# Patient Record
Sex: Male | Born: 1970 | Hispanic: Yes | Marital: Married | Smoking: Former smoker
Health system: Southern US, Community
[De-identification: ages and names within clinical notes are randomized; demographics above are authoritative.]

## PROBLEM LIST (undated history)

## (undated) DIAGNOSIS — E119 Type 2 diabetes mellitus without complications: Secondary | ICD-10-CM

## (undated) DIAGNOSIS — F419 Anxiety disorder, unspecified: Secondary | ICD-10-CM

## (undated) DIAGNOSIS — E7800 Pure hypercholesterolemia, unspecified: Secondary | ICD-10-CM

## (undated) DIAGNOSIS — I10 Essential (primary) hypertension: Secondary | ICD-10-CM

---

## 2014-08-30 HISTORY — PX: CARDIAC CATHETERIZATION: SHX172

## 2016-01-29 ENCOUNTER — Encounter (HOSPITAL_COMMUNITY): Payer: Self-pay | Admitting: Emergency Medicine

## 2016-01-29 ENCOUNTER — Observation Stay (HOSPITAL_COMMUNITY)
Admission: EM | Admit: 2016-01-29 | Discharge: 2016-01-30 | Disposition: A | Discharge status: Home / Self Care | Payer: Self-pay | Attending: Internal Medicine | Admitting: Internal Medicine

## 2016-01-29 DIAGNOSIS — R778 Other specified abnormalities of plasma proteins: Secondary | ICD-10-CM

## 2016-01-29 DIAGNOSIS — I160 Hypertensive urgency: Secondary | ICD-10-CM | POA: Insufficient documentation

## 2016-01-29 DIAGNOSIS — Z6836 Body mass index (BMI) 36.0-36.9, adult: Secondary | ICD-10-CM | POA: Insufficient documentation

## 2016-01-29 DIAGNOSIS — I10 Essential (primary) hypertension: Principal | ICD-10-CM | POA: Insufficient documentation

## 2016-01-29 DIAGNOSIS — E669 Obesity, unspecified: Secondary | ICD-10-CM | POA: Insufficient documentation

## 2016-01-29 DIAGNOSIS — R7989 Other specified abnormal findings of blood chemistry: Secondary | ICD-10-CM

## 2016-01-29 HISTORY — DX: Essential (primary) hypertension: I10

## 2016-01-29 LAB — BASIC METABOLIC PANEL
ANION GAP: 7 (ref 5–15)
BUN: 8 mg/dL (ref 6–20)
CALCIUM: 8.8 mg/dL — AB (ref 8.9–10.3)
CHLORIDE: 105 mmol/L (ref 101–111)
CO2: 27 mmol/L (ref 22–32)
Creatinine, Ser: 0.65 mg/dL (ref 0.61–1.24)
GFR calc non Af Amer: >60 mL/min (ref >60)
Glucose, Bld: 103 mg/dL — ABNORMAL HIGH (ref 65–99)
Potassium: 3.7 mmol/L (ref 3.5–5.1)
SODIUM: 139 mmol/L (ref 135–145)

## 2016-01-29 LAB — URINE MICROSCOPIC-ADD ON

## 2016-01-29 LAB — URINALYSIS, ROUTINE W REFLEX MICROSCOPIC
BILIRUBIN URINE: NEGATIVE
GLUCOSE, UA: NEGATIVE mg/dL
KETONES UR: NEGATIVE mg/dL
Leukocytes, UA: NEGATIVE
Nitrite: NEGATIVE
PH: 7.5 (ref 5.0–8.0)
PROTEIN: NEGATIVE mg/dL
Specific Gravity, Urine: 1.009 (ref 1.005–1.030)

## 2016-01-29 LAB — CBC
HEMATOCRIT: 43.9 % (ref 39.0–52.0)
Hemoglobin: 16 g/dL (ref 13.0–17.0)
MCH: 31.9 pg (ref 26.0–34.0)
MCHC: 36.4 g/dL — AB (ref 30.0–36.0)
MCV: 87.5 fL (ref 78.0–100.0)
PLATELETS: 210 10*3/uL (ref 150–400)
RBC: 5.02 MIL/uL (ref 4.22–5.81)
RDW: 12.4 % (ref 11.5–15.5)
WBC: 9.6 10*3/uL (ref 4.0–10.5)

## 2016-01-29 LAB — TROPONIN I: Troponin I: 0.07 ng/mL — ABNORMAL HIGH (ref <0.031)

## 2016-01-29 LAB — CBG MONITORING, ED: Glucose-Capillary: 93 mg/dL (ref 65–99)

## 2016-01-29 MED ORDER — HYDRALAZINE HCL 20 MG/ML IJ SOLN
10.0000 mg | INTRAMUSCULAR | Status: DC | PRN
  Administered 2016-01-29 – 2016-01-30 (×2): 10 mg via INTRAVENOUS
  Filled 2016-01-29 (×2): qty 1

## 2016-01-29 MED ORDER — AMLODIPINE BESYLATE 5 MG PO TABS
5.0000 mg | ORAL_TABLET | Freq: Every day | ORAL | Status: DC
  Administered 2016-01-30: 5 mg via ORAL
  Filled 2016-01-29: qty 1

## 2016-01-29 MED ORDER — ASPIRIN 81 MG PO CHEW
CHEWABLE_TABLET | ORAL | Status: AC
  Administered 2016-01-29: 324 mg via ORAL
  Filled 2016-01-29: qty 4

## 2016-01-29 MED ORDER — CLONIDINE HCL 0.1 MG PO TABS
0.2000 mg | ORAL_TABLET | Freq: Once | ORAL | Status: AC
  Administered 2016-01-29: 0.2 mg via ORAL
  Filled 2016-01-29: qty 2

## 2016-01-29 MED ORDER — ENOXAPARIN SODIUM 40 MG/0.4ML ~~LOC~~ SOLN
40.0000 mg | SUBCUTANEOUS | Status: DC
  Administered 2016-01-30: 40 mg via SUBCUTANEOUS
  Filled 2016-01-29: qty 0.4

## 2016-01-29 MED ORDER — ASPIRIN 81 MG PO CHEW
324.0000 mg | CHEWABLE_TABLET | Freq: Once | ORAL | Status: AC
  Administered 2016-01-29: 324 mg via ORAL

## 2016-01-29 NOTE — H&P (Signed)
History and Physical    Cameron Fling Huey ZOX:[id number] DOB: Nov 09, 1970 DOA: 01/29/2016   PCP: No primary care provider on file. Chief Complaint:  Chief Complaint  Patient presents with  . Dizziness  . Hypertension    HPI: Cameron Haney is a 45 y.o. male with medical history significant of hypertension, treated for this in the past in [place] but not currently on any treatment for it.  He presents to the ED with c/o dizziness.  Has history of same associated with hypertension in the past.  No chest pain, no SOB, does have bilateral headache, no N/V, no edema.  No treatment PTA  ED Course: Initial BP 192/119, given clonadine in ED and BP came down to 161/116.  Troponin is 0.07, EKG is strongly suggestive of LVH with a secondary repol abnormality.  Review of Systems: As per HPI otherwise 10 point review of systems negative.    Past Medical History  Diagnosis Date  . Hypertension     History reviewed. No pertinent past surgical history.   reports that he has never smoked. He does not have any smokeless tobacco history on file. He reports that he drinks alcohol. His drug history is not on file.  No Known Allergies  History reviewed. No pertinent family history.   Prior to Admission medications   Medication Sig Start Date End Date Taking? Authorizing Provider  alum & mag hydroxide-simeth (MAALOX ADVANCED) 200-200-20 MG/5ML suspension Take 30 mLs by mouth every 6 (six) hours as needed for indigestion or heartburn.   Yes Historical Provider, MD    Physical Exam: Filed Vitals:   01/29/16 2200 01/29/16 2228 01/29/16 2230 01/29/16 2300  BP: 183/103 147/112 159/105 161/116  Pulse: 76 76 75 73  Temp:      TempSrc:      Resp: SpO2: 100% 98% 100% 98%      Constitutional: NAD, calm, comfortable Eyes: PERRL, lids and conjunctivae normal ENMT: Mucous membranes are moist. Posterior pharynx clear of any exudate or lesions.Normal dentition.  Neck: normal,  supple, no masses, no thyromegaly Respiratory: clear to auscultation bilaterally, no wheezing, no crackles. Normal respiratory effort. No accessory muscle use.  Cardiovascular: Regular rate and rhythm, no murmurs / rubs / gallops. No extremity edema. 2+ pedal pulses. No carotid bruits.  Abdomen: no tenderness, no masses palpated. No hepatosplenomegaly. Bowel sounds positive.  Musculoskeletal: no clubbing / cyanosis. No joint deformity upper and lower extremities. Good ROM, no contractures. Normal muscle tone.  Skin: no rashes, lesions, ulcers. No induration Neurologic: CN 2-12 grossly intact. Sensation intact, DTR normal. Strength 5/5 in all 4.  Psychiatric: Normal judgment and insight. Alert and oriented x 3. Normal mood.    Labs on Admission: I have personally reviewed following labs and imaging studies  CBC:  Recent Labs Lab 01/29/16 1940  WBC 9.6  HGB 16.0  HCT 43.9  MCV 87.5  PLT 210   Basic Metabolic Panel:  Recent Labs Lab 01/29/16 1940  NA 139  K 3.7  CL 105  CO2 27  GLUCOSE 103*  BUN 8  CREATININE 0.65  CALCIUM 8.8*   GFR: CrCl cannot be calculated (Unknown ideal weight.). Liver Function Tests: No results for input(s): AST, ALT, ALKPHOS, BILITOT, PROT, ALBUMIN in the last 168 hours. No results for input(s): LIPASE, AMYLASE in the last 168 hours. No results for input(s): AMMONIA in the last 168 hours. Coagulation Profile: No results for input(s): INR, PROTIME in the last 168 hours.  Cardiac Enzymes:  Recent Labs Lab 01/29/16 2120  TROPONINI 0.07*   BNP (last 3 results) No results for input(s): PROBNP in the last 8760 hours. HbA1C: No results for input(s): HGBA1C in the last 72 hours. CBG:  Recent Labs Lab 01/29/16 1955  GLUCAP 93   Lipid Profile: No results for input(s): CHOL, HDL, LDLCALC, TRIG, CHOLHDL, LDLDIRECT in the last 72 hours. Thyroid Function Tests: No results for input(s): TSH, T4TOTAL, FREET4, T3FREE, THYROIDAB in the last 72  hours. Anemia Panel: No results for input(s): VITAMINB12, FOLATE, FERRITIN, TIBC, IRON, RETICCTPCT in the last 72 hours. Urine analysis:    Component Value Date/Time   COLORURINE YELLOW 01/29/2016 2128   APPEARANCEUR CLOUDY* 01/29/2016 2128   LABSPEC 1.009 01/29/2016 2128   PHURINE 7.5 01/29/2016 2128   GLUCOSEU NEGATIVE 01/29/2016 2128   HGBUR TRACE* 01/29/2016 2128   BILIRUBINUR NEGATIVE 01/29/2016 2128   KETONESUR NEGATIVE 01/29/2016 2128   PROTEINUR NEGATIVE 01/29/2016 2128   NITRITE NEGATIVE 01/29/2016 2128   LEUKOCYTESUR NEGATIVE 01/29/2016 2128   Sepsis Labs: @LABRCNTIP (procalcitonin:4,lacticidven:4) )No results found for this or any previous visit (from the past 240 hour(s)).   Radiological Exams on Admission: No results found.  EKG: Independently reviewed.  Assessment/Plan Principal Problem:   Hypertensive urgency Active Problems:   Hypertension    Hypertensive urgency -  PRN hydralazine  Starting norvasc tomorrow AM  Likely will need long term treatment and follow up  2d echo ordered given the EKG findings suggesting LVH and changes worrisome for hypertensive cardiomyopathy  Serial trops ordered  Tele monitor ordered  Not keeping patient NPO at this time as he is having no chest pain and EDP had a hard enough time just convincing him to stay.     DVT prophylaxis: Lovenox Code Status: Full Family Communication: Wife at bedside Consults called: None Admission status: Admit to obs   GARDNER, Heywood IlesJARED M. DO [facility] Pager [phone] from 7PM-7AM  If 7AM-7PM, please contact the day physician for the patient [email or web address] Password [email or web address]  01/29/2016, 11:23 PM

## 2016-01-29 NOTE — ED Notes (Signed)
Pt unable to void at this time. Pt states he urinated before coming to the room and does not have the urge to go at this time.

## 2016-01-29 NOTE — ED Provider Notes (Addendum)
CSN: [id number]     Arrival date & time 01/29/16  1830 History   First MD Initiated Contact with Patient 01/29/16 2054     Chief Complaint  Patient presents with  . Dizziness  . Hypertension     (Consider location/radiation/quality/duration/timing/severity/associated sxs/prior Treatment) HPI Comments: Patient here complaining of several days of dizziness with associated hypertension. Has been treated for hypertension in the past but does not take any medications currently. Denies any anginal type chest pain. Has had a mild headache that is located bitemporally and without associated nausea vomiting. Denies any dyspnea. No lower extremity edema. Symptoms persistent nothing makes them better or worse no treatment use prior to arrival  Patient is a 45 y.o. male presenting with dizziness and hypertension. The history is provided by the patient.  Dizziness Hypertension    Past Medical History  Diagnosis Date  . Hypertension    History reviewed. No pertinent past surgical history. History reviewed. No pertinent family history. Social History  Substance Use Topics  . Smoking status: Never Smoker   . Smokeless tobacco: None  . Alcohol Use: Yes    Review of Systems  Neurological: Positive for dizziness.  All other systems reviewed and are negative.     Allergies  Review of patient's allergies indicates no known allergies.  Home Medications   Prior to Admission medications   Not on File   BP 180/113 mmHg  Pulse 87  Temp(Src) 98.1 F (36.7 C) (Oral)  Resp 20  SpO2 100% Physical Exam  Constitutional: He is oriented to person, place, and time. He appears well-developed and well-nourished.  Non-toxic appearance. No distress.  HENT:  Head: Normocephalic and atraumatic.  Eyes: Conjunctivae, EOM and lids are normal. Pupils are equal, round, and reactive to light.  Neck: Normal range of motion. Neck supple. No tracheal deviation present. No thyroid mass present.   Cardiovascular: Normal rate, regular rhythm and normal heart sounds.  Exam reveals no gallop.   No murmur heard. Pulmonary/Chest: Effort normal and breath sounds normal. No stridor. No respiratory distress. He has no decreased breath sounds. He has no wheezes. He has no rhonchi. He has no rales.  Abdominal: Soft. Normal appearance and bowel sounds are normal. He exhibits no distension. There is no tenderness. There is no rebound and no CVA tenderness.  Musculoskeletal: Normal range of motion. He exhibits no edema or tenderness.  Neurological: He is alert and oriented to person, place, and time. He has normal strength. No cranial nerve deficit or sensory deficit. GCS eye subscore is 4. GCS verbal subscore is 5. GCS motor subscore is 6.  Skin: Skin is warm and dry. No abrasion and no rash noted.  Psychiatric: He has a normal mood and affect. His speech is normal and behavior is normal.  Nursing note and vitals reviewed.   ED Course  Procedures (including critical care time) Labs Review Labs Reviewed  BASIC METABOLIC PANEL - Abnormal; Notable for the following:    Glucose, Bld 103 (*)    Calcium 8.8 (*)    All other components within normal limits  CBC - Abnormal; Notable for the following:    MCHC 36.4 (*)    All other components within normal limits  URINALYSIS, ROUTINE W REFLEX MICROSCOPIC (NOT AT [facility])  CBG MONITORING, ED    Imaging Review No results found. I have personally reviewed and evaluated these images and lab results as part of my medical decision-making.   EKG Interpretation   Date/Time:  Thursday January 29 2016 19:45:22 EDT Ventricular Rate:  84 PR Interval:  154 QRS Duration: 81 QT Interval:  387 QTC Calculation: 457 R Axis:   26 Text Interpretation:  Sinus rhythm LVH with secondary repolarization  abnormality Anterior ST elevation, probably due to LVH Confirmed by Shiree Altemus   MD, Ryan Palermo ([id number]) on 01/29/2016 9:08:52 PM      MDM   Final diagnoses:  None    Patient given aspirin here as well as clonidine 0.2 mg for his hypertension. He is chest pain-free here. Blood pressure has improved. Troponin elevation noted. Will be admitted to the hospital for observation     Lorre Nick, MD 01/29/16 2315  Lorre Nick, MD 01/29/16 2315

## 2016-01-29 NOTE — Progress Notes (Signed)
EDCM spoke to patient at bedside. Patient confirms he [place] have a pcp or [place].  [facility] informed patient of services there.  EDCM also provided patient with list of pcps who accept self pay patients, list of discount pharmacies and websites needymeds.org and GoodRX.com for medication assistance, phone number to inquire about the orange card, phone number to inquire about Medicaid, phone number to inquire about the Affordable Care Act, financial resources in the community such as local churches, salvation [employer], urban ministries, and dental assistance for uninsured patients.  Patient thankful for resources.  No further EDCM needs at this time.  The above information was provided to the patient in Spanish.  Patient able to read information and thankful for services.

## 2016-01-29 NOTE — ED Notes (Signed)
RN will draw blood work from [employer]

## 2016-01-29 NOTE — ED Notes (Signed)
Patient here with complaints of dizziness associated with hypertension. Reports being treated for hypertension in [place]. Last alcohol drink 10 days ago.

## 2016-01-30 ENCOUNTER — Encounter (HOSPITAL_COMMUNITY): Payer: Self-pay

## 2016-01-30 ENCOUNTER — Observation Stay (HOSPITAL_BASED_OUTPATIENT_CLINIC_OR_DEPARTMENT_OTHER): Payer: MEDICAID

## 2016-01-30 DIAGNOSIS — R9431 Abnormal electrocardiogram [ECG] [EKG]: Secondary | ICD-10-CM

## 2016-01-30 DIAGNOSIS — I160 Hypertensive urgency: Secondary | ICD-10-CM

## 2016-01-30 LAB — TROPONIN I
TROPONIN I: 0.36 ng/mL — AB (ref <0.031)
Troponin I: 0.06 ng/mL — ABNORMAL HIGH (ref <0.031)
Troponin I: 0.07 ng/mL — ABNORMAL HIGH (ref <0.031)

## 2016-01-30 LAB — ECHOCARDIOGRAM COMPLETE
HEIGHTINCHES: 63 in
WEIGHTICAEL: 3280 [oz_av]

## 2016-01-30 MED ORDER — LISINOPRIL 5 MG PO TABS: 5.0000 mg | ORAL_TABLET | Freq: Every day | ORAL | Status: DC

## 2016-01-30 MED ORDER — CARVEDILOL 6.25 MG PO TABS
6.2500 mg | ORAL_TABLET | Freq: Two times a day (BID) | ORAL | Status: DC
  Administered 2016-01-30: 6.25 mg via ORAL
  Filled 2016-01-30: qty 1

## 2016-01-30 MED ORDER — ASPIRIN EC 81 MG PO TBEC: 81.0000 mg | DELAYED_RELEASE_TABLET | Freq: Every day | ORAL | Status: DC

## 2016-01-30 MED ORDER — LISINOPRIL 10 MG PO TABS
5.0000 mg | ORAL_TABLET | Freq: Every day | ORAL | Status: DC
  Administered 2016-01-30: 5 mg via ORAL
  Filled 2016-01-30: qty 1

## 2016-01-30 MED ORDER — CARVEDILOL 6.25 MG PO TABS: 6.2500 mg | ORAL_TABLET | Freq: Two times a day (BID) | ORAL | Status: DC

## 2016-01-30 NOTE — Progress Notes (Signed)
Patient discharged home, discharge instructions given and explained to patient/wife and they verbalized understanding, denies any pain/distrss, Skin intact, no wound noted accompanied home by friend.

## 2016-01-30 NOTE — Care Management Note (Signed)
Case Management Note  Patient Details  Name: Era SkeenCruz De Jesus Burt MRN: [id number] Date of Birth: 10/29/1970  Subjective/Objective: Patient has pcp appt @ [facility]-patient voiced understanding.                 Action/Plan:d/c home.   Expected Discharge Date:                  Expected Discharge Plan:  Home/Self Care  In-House Referral:     Discharge planning Services  CM Consult  Post Acute Care Choice:    Choice offered to:     DME Arranged:    DME Agency:     HH Arranged:    HH Agency:     Status of Service:  Completed, signed off  Medicare Important Message Given:    Date Medicare IM Given:    Medicare IM give by:    Date Additional Medicare IM Given:    Additional Medicare Important Message give by:     If discussed at Long Length of Stay Meetings, dates discussed:    Additional Comments:  Lanier ClamMahabir, Colleena Kurtenbach, RN 01/30/2016, 11:19 AM

## 2016-01-30 NOTE — Care Management Note (Signed)
Case Management Note  Patient Details  Name: Cameron Haney MRN: [id number] Date of Birth: 04/20/1971  Subjective/Objective:  45 y/o m admitted w/htn urgency. From home. No insurance-[facility] for [facility], & meds-patient informed. Patient works-[facility]'t [facility]. [facility] liason-will attempt to set up for pcp appt,but patient aware to call on own to schedule appt.$[facility] med list given, Community resources given, & health insurance ino given.                 Action/Plan:d/c plan home.   Expected Discharge Date:                  Expected Discharge Plan:  Home/Self Care  In-House Referral:     Discharge planning Services  CM Consult  Post Acute Care Choice:    Choice offered to:     DME Arranged:    DME Agency:     HH Arranged:    HH Agency:     Status of Service:  Completed, signed off  Medicare Important Message Given:    Date Medicare IM Given:    Medicare IM give by:    Date Additional Medicare IM Given:    Additional Medicare Important Message give by:     If discussed at Long Length of Stay Meetings, dates discussed:    Additional Comments:  Lanier ClamMahabir, Donyale Falcon, RN 01/30/2016, 11:02 AM

## 2016-01-30 NOTE — Progress Notes (Signed)
Echocardiogram 2D Echocardiogram has been performed.  Dorothey BasemanReel, Tris Howell M 01/30/2016, 9:18 AM

## 2016-01-30 NOTE — Discharge Summary (Signed)
Cameron Haney, is a 45 y.o. male  DOB 05/09/1971  MRN [id number].  Admission date:  01/29/2016  Admitting Physician  Hillary BowJared M Gardner, DO  Discharge Date:  01/30/2016   Primary MD  No primary care provider on file.  Recommendations for primary care physician for things to follow:   Monitor blood pressure, routine health maintenance.   Admission Diagnosis  Elevated troponin [R79.89] Essential hypertension [I10]   Discharge Diagnosis  Elevated troponin [R79.89] Essential hypertension [I10]    Principal Problem:   Hypertensive urgency Active Problems:   Hypertension      Past Medical History  Diagnosis Date  . Hypertension     History reviewed. No pertinent past surgical history.     HPI  from the history and physical done on the day of admission:     Cameron KnudsenCruz De Jesus Ike Haney is a 45 y.o. male with medical history significant of hypertension, treated for this in the past in [place] but not currently on any treatment for it. He presents to the ED with c/o dizziness. Has history of same associated with hypertension in the past. No chest pain, no SOB, does have bilateral headache, no N/V, no edema. No treatment PTA  ED Course: Initial BP 192/119, given clonadine in ED and BP came down to 161/116. Troponin is 0.07, EKG is strongly suggestive of LVH with a secondary repol abnormality.    Hospital Course:     1. Hypertensive crisis. Much improved, symptom-free, placed on Coreg and lisinopril, case parchment help to assist with medications and PCP follow-up. Troponin mildly elevated in non-ACS pattern and a flat trajectory secondary to demand ischemia and LVH phenomenon due to uncontrolled blood pressure. No chest pain. Discussed with cardiologist Dr. Jens Somrenshaw. Echogram reviewed which was unremarkable  with preserved EF and no wall motion of normality. Will be discharged home on aspirin, Coreg and lisinopril for blood pressure control. Request PCP to monitor blood pressure closely.  2. Obesity. Follow with PCP for diet control and weight loss..   Follow UP  Follow-up Information    Follow up with [facility]. Schedule an appointment as soon as possible for a visit in 1 week.   Contact information:   [address] [place] [phone]       Consults obtained - Cardiology Dr. Jens Somrenshaw over the phone  Discharge Condition: Stable  Diet and Activity recommendation: See Discharge Instructions below  Discharge Instructions       Discharge Instructions    Diet - low sodium heart healthy    Complete by:  As directed      Discharge instructions    Complete by:  As directed   Follow with Primary MD  in 7 days   Get CBC, CMP, 2 view Chest X ray checked  by Primary MD next visit.    Activity: As tolerated with Full fall precautions use walker/cane & assistance as needed   Disposition Home     Diet:  Heart Healthy    For Heart failure patients - Check your Weight same time everyday, if you gain over 2 pounds, or you develop in leg swelling, experience more shortness of breath or chest pain, call your Primary MD immediately. Follow Cardiac Low Salt Diet and 1.5 lit/day fluid restriction.   On your next visit with your primary care physician please Get Medicines reviewed and adjusted.   Please request your Prim.MD to go over all Hospital Tests and Procedure/Radiological results at the follow up, please get all Hospital records sent to your Prim MD by signing hospital release before you go home.   If you experience worsening of your admission symptoms, develop shortness of breath, life threatening emergency, suicidal or homicidal thoughts you must seek medical attention immediately by calling 911 or calling your MD  immediately  if symptoms less severe.  You Must read complete instructions/literature along with all the possible adverse reactions/side effects for all the Medicines you take and that have been prescribed to you. Take any new Medicines after you have completely understood and accpet all the possible adverse reactions/side effects.   Do not drive, operate heavy machinery, perform activities at heights, swimming or participation in water activities or provide baby sitting services if your were admitted for syncope or siezures until you have seen by Primary MD or a Neurologist and advised to do so again.  Do not drive when taking Pain medications.    Do not take more than prescribed Pain, Sleep and Anxiety Medications  Special Instructions: If you have smoked or chewed Tobacco  in the last 2 yrs please stop smoking, stop any regular Alcohol  and or any Recreational drug use.  Wear Seat belts while driving.   Please note  You were cared for by a hospitalist during your hospital stay. If you have any questions about your discharge medications or the care you received while you were in the hospital after you are discharged, you can call the unit and asked to speak with the hospitalist on call if the hospitalist that took care of you is not available. Once you are discharged, your primary care physician will handle any further medical issues. Please note that NO REFILLS for any discharge medications will be authorized once you are discharged, as it is imperative that you return to your primary care physician (or establish a relationship with a primary care physician if you do not have one) for your aftercare needs so that they can reassess your need for medications and monitor your lab values.     Increase activity slowly    Complete by:  As directed              Discharge Medications       Medication List    TAKE these medications        aspirin EC 81 MG tablet  Take 1 tablet (81 mg  total) by mouth daily.     carvedilol 6.25 MG tablet  Commonly known as:  COREG  Take 1 tablet (6.25 mg total) by mouth 2 (two) times daily with a meal.     lisinopril 5 MG tablet  Commonly known as:  PRINIVIL,ZESTRIL  Take 1 tablet (5 mg total) by mouth daily.     MAALOX ADVANCED 200-200-20 MG/5ML suspension  Generic drug:  alum & mag hydroxide-simeth  Take 30 mLs by mouth every 6 (six) hours as needed for indigestion or heartburn.        Major procedures and Radiology  Reports - PLEASE review detailed and final reports for all details, in brief -   TTE  - Left ventricle: The cavity size was normal. There was moderate concentric hypertrophy. Systolic function was normal. The estimated ejection fraction was in the range of 55% to 60%. Wall motion was normal; there were no regional wall motion abnormalities. Features are consistent with a pseudonormal left ventricular filling pattern, with concomitant abnormal relaxation and increased filling pressure (grade 2 diastolic dysfunction). Doppler parameters are consistent with high ventricular filling pressure. - Aortic valve: Trileaflet; normal thickness, mildly calcified leaflets. There was mild regurgitation. - Mitral valve: There was trivial regurgitation. - Left atrium: The atrium was mildly dilated. - Right atrium: The atrium was mildly dilated. - Pulmonary arteries: PA peak pressure: 32 mm Hg (S).   No results found.  Micro Results      No results found for this or any previous visit (from the past 240 hour(s)).     Today   Subjective    Bao Bazen today has no headache,no chest abdominal pain,no new weakness tingling or numbness, feels much better wants to go home today.     Objective   Blood pressure 138/89, pulse 72, temperature 98.2 F (36.8 C), temperature source Oral, resp. rate 18, height  (1.6 m), weight 92.987 kg (205 lb), SpO2 100 %.   Intake/Output Summary (Last 24 hours) at 01/30/16 1055 Last  data filed at 01/30/16 0917  Gross per 24 hour  Intake    240 ml  Output      0 ml  Net    240 ml    Exam Awake Alert, Oriented x 3, No new F.N deficits, Normal affect [place].AT,PERRAL Supple Neck,No JVD, No cervical lymphadenopathy appriciated.  Symmetrical Chest wall movement, Good air movement bilaterally, CTAB RRR,No Gallops,Rubs or new Murmurs, No Parasternal Heave +ve B.Sounds, Abd Soft, Non tender, No organomegaly appriciated, No rebound -guarding or rigidity. No Cyanosis, Clubbing or edema, No new Rash or bruise   Data Review   CBC w Diff: Lab Results  Component Value Date   WBC 9.6 01/29/2016   HGB 16.0 01/29/2016   HCT 43.9 01/29/2016   PLT 210 01/29/2016    CMP: Lab Results  Component Value Date   NA 139 01/29/2016   K 3.7 01/29/2016   CL 105 01/29/2016   CO2 27 01/29/2016   BUN 8 01/29/2016   CREATININE 0.65 01/29/2016  .   Total Time in preparing paper work, data evaluation and todays exam - 35 minutes  Leroy Sea M.D on 01/30/2016 at 10:55 AM  [facility]   Office  [phone]

## 2016-01-30 NOTE — Discharge Instructions (Signed)
Follow with Primary MD  in 7 days  ° °Get CBC, CMP, 2 view Chest X ray checked  by Primary MD next visit.  ° ° °Activity: As tolerated with Full fall precautions use walker/cane & assistance as needed ° ° °Disposition Home  ° ° °Diet:   Heart Healthy  . ° °For Heart failure patients - Check your Weight same time everyday, if you gain over 2 pounds, or you develop in leg swelling, experience more shortness of breath or chest pain, call your Primary MD immediately. Follow Cardiac Low Salt Diet and 1.5 lit/day fluid restriction. ° ° °On your next visit with your primary care physician please Get Medicines reviewed and adjusted. ° ° °Please request your Prim.MD to go over all Hospital Tests and Procedure/Radiological results at the follow up, please get all Hospital records sent to your Prim MD by signing hospital release before you go home. ° ° °If you experience worsening of your admission symptoms, develop shortness of breath, life threatening emergency, suicidal or homicidal thoughts you must seek medical attention immediately by calling 911 or calling your MD immediately  if symptoms less severe. ° °You Must read complete instructions/literature along with all the possible adverse reactions/side effects for all the Medicines you take and that have been prescribed to you. Take any new Medicines after you have completely understood and accpet all the possible adverse reactions/side effects.  ° °Do not drive, operate heavy machinery, perform activities at heights, swimming or participation in water activities or provide baby sitting services if your were admitted for syncope or siezures until you have seen by Primary MD or a Neurologist and advised to do so again. ° °Do not drive when taking Pain medications.  ° ° °Do not take more than prescribed Pain, Sleep and Anxiety Medications ° °Special Instructions: If you have smoked or chewed Tobacco  in the last 2 yrs please stop smoking, stop any regular Alcohol  and or  any Recreational drug use. ° °Wear Seat belts while driving. ° ° °Please note ° °You were cared for by a hospitalist during your hospital stay. If you have any questions about your discharge medications or the care you received while you were in the hospital after you are discharged, you can call the unit and asked to speak with the hospitalist on call if the hospitalist that took care of you is not available. Once you are discharged, your primary care physician will handle any further medical issues. Please note that NO REFILLS for any discharge medications will be authorized once you are discharged, as it is imperative that you return to your primary care physician (or establish a relationship with a primary care physician if you do not have one) for your aftercare needs so that they can reassess your need for medications and monitor your lab values. ° ° °

## 2016-02-27 ENCOUNTER — Ambulatory Visit: Payer: Self-pay | Admitting: Family Medicine

## 2016-03-15 ENCOUNTER — Ambulatory Visit: Payer: Self-pay | Admitting: Family Medicine

## 2016-06-30 ENCOUNTER — Emergency Department (HOSPITAL_COMMUNITY): Payer: Self-pay

## 2016-06-30 ENCOUNTER — Encounter (HOSPITAL_COMMUNITY): Payer: Self-pay | Admitting: Emergency Medicine

## 2016-06-30 ENCOUNTER — Emergency Department (HOSPITAL_COMMUNITY)
Admission: EM | Admit: 2016-06-30 | Discharge: 2016-06-30 | Disposition: A | Discharge status: Home / Self Care | Payer: Self-pay | Attending: Emergency Medicine | Admitting: Emergency Medicine

## 2016-06-30 DIAGNOSIS — R42 Dizziness and giddiness: Secondary | ICD-10-CM

## 2016-06-30 DIAGNOSIS — Z7982 Long term (current) use of aspirin: Secondary | ICD-10-CM | POA: Insufficient documentation

## 2016-06-30 DIAGNOSIS — I10 Essential (primary) hypertension: Secondary | ICD-10-CM | POA: Insufficient documentation

## 2016-06-30 DIAGNOSIS — Z791 Long term (current) use of non-steroidal anti-inflammatories (NSAID): Secondary | ICD-10-CM | POA: Insufficient documentation

## 2016-06-30 DIAGNOSIS — Z79899 Other long term (current) drug therapy: Secondary | ICD-10-CM | POA: Insufficient documentation

## 2016-06-30 LAB — CBC WITH DIFFERENTIAL/PLATELET
BASOS PCT: 0 %
Basophils Absolute: 0 10*3/uL (ref 0.0–0.1)
EOS ABS: 0.5 10*3/uL (ref 0.0–0.7)
Eosinophils Relative: 6 %
HCT: 43.2 % (ref 39.0–52.0)
HEMOGLOBIN: 15.6 g/dL (ref 13.0–17.0)
LYMPHS ABS: 2.6 10*3/uL (ref 0.7–4.0)
Lymphocytes Relative: 27 %
MCH: 31.3 pg (ref 26.0–34.0)
MCHC: 36.1 g/dL — AB (ref 30.0–36.0)
MCV: 86.7 fL (ref 78.0–100.0)
MONO ABS: 0.7 10*3/uL (ref 0.1–1.0)
MONOS PCT: 7 %
NEUTROS PCT: 60 %
Neutro Abs: 5.6 10*3/uL (ref 1.7–7.7)
Platelets: 211 10*3/uL (ref 150–400)
RBC: 4.98 MIL/uL (ref 4.22–5.81)
RDW: 12.7 % (ref 11.5–15.5)
WBC: 9.4 10*3/uL (ref 4.0–10.5)

## 2016-06-30 LAB — BASIC METABOLIC PANEL
Anion gap: 8 (ref 5–15)
BUN: 14 mg/dL (ref 6–20)
CALCIUM: 9.1 mg/dL (ref 8.9–10.3)
CHLORIDE: 106 mmol/L (ref 101–111)
CO2: 25 mmol/L (ref 22–32)
CREATININE: 0.74 mg/dL (ref 0.61–1.24)
GFR calc Af Amer: >60 mL/min (ref >60)
GFR calc non Af Amer: >60 mL/min (ref >60)
GLUCOSE: 129 mg/dL — AB (ref 65–99)
Potassium: 3.7 mmol/L (ref 3.5–5.1)
Sodium: 139 mmol/L (ref 135–145)

## 2016-06-30 MED ORDER — MECLIZINE HCL 25 MG PO TABS
25.0000 mg | ORAL_TABLET | Freq: Once | ORAL | Status: DC
Start: 2016-06-30 — End: 2016-06-30

## 2016-06-30 MED ORDER — MECLIZINE HCL 25 MG PO TABS: 25.0000 mg | ORAL_TABLET | Freq: Three times a day (TID) | ORAL | 0 refills | Status: DC | PRN

## 2016-06-30 NOTE — ED Provider Notes (Signed)
[facility]EMERGENCY DEPT Provider Note   CSN: [id number] Arrival date & time: 06/30/16  [id number]     History   Chief Complaint Chief Complaint  Patient presents with  . Headache   History of present illness obtained via Spanish interpreter. HPI Cameron Haney is a 45 y.o. male.  HPIwith history of hypertension here for evaluation of headache, dizziness, anxiety, right arm pain. Patient reports over the past 18 days he has had intermittent headache with associated dizziness but he cannot clarify. No dizziness now. He reports anxiety that sometimes causes his blood pressure to rise which results in his headache. He reports right-sided, sharp arm pain that radiates into his fingers. This started last week has not relieved by ibuprofen. He denies fevers, chills, shortness of breath, chest pain, nausea or vomiting, abdominal pain, diarrhea or constipation.reports he had [place] doctor in [place], but came here 4 months ago and has not secured a primary care provider here.  Past Medical History:  Diagnosis Date  . Hypertension     Patient Active Problem List   Diagnosis Date Noted  . Hypertension 01/29/2016  . Hypertensive urgency 01/29/2016    History reviewed. No pertinent surgical history.     Home Medications    Prior to Admission medications   Medication Sig Start Date End Date Taking? Authorizing Provider  alum & mag hydroxide-simeth (MAALOX ADVANCED) 200-200-20 MG/5ML suspension Take 30 mLs by mouth every 6 (six) hours as needed for indigestion or heartburn.   Yes Historical Provider, MD  carvedilol (COREG) 6.25 MG tablet Take 1 tablet (6.25 mg total) by mouth 2 (two) times daily with a meal. Patient taking differently: Take 6.25 mg by mouth 2 (two) times daily as needed (high blood pressure).  01/30/16  Yes Leroy SeaPrashant K Singh, MD  ibuprofen (ADVIL,MOTRIN) 200 MG tablet Take 200-800 mg by mouth every 6 (six) hours as needed for moderate pain.   Yes Historical Provider,  MD  lisinopril (PRINIVIL,ZESTRIL) 5 MG tablet Take 1 tablet (5 mg total) by mouth daily. Patient taking differently: Take 5 mg by mouth daily as needed (high blood pressure).  01/30/16  Yes Leroy SeaPrashant K Singh, MD  aspirin EC 81 MG tablet Take 1 tablet (81 mg total) by mouth daily. Patient not taking: Reported on 06/30/2016 01/30/16   Leroy SeaPrashant K Singh, MD  meclizine (ANTIVERT) 25 MG tablet Take 1 tablet (25 mg total) by mouth 3 (three) times daily as needed for dizziness. 06/30/16   Joycie PeekBenjamin Daniil Labarge, PA-C    Family History No family history on file.  Social History Social History  Substance Use Topics  . Smoking status: Never Smoker  . Smokeless tobacco: Never Used  . Alcohol use Yes     Allergies   Aspirin   Review of Systems Review of Systems A 10 point review of systems was completed and was negative except for pertinent positives and negatives as mentioned in the history of present illness    Physical Exam Updated Vital Signs BP (!) 170/106 (BP Location: Left Arm)   Pulse 77   Temp 98.9 F (37.2 C) (Oral)   Resp 17   Ht 5\' 6"  (1.676 m)   Wt 93 kg   SpO2 98%   BMI 33.09 kg/m   Physical Exam  Constitutional: He is oriented to person, place, and time. He appears well-developed and well-nourished. No distress.  HENT:  Head: Normocephalic and atraumatic.  Right Ear: External ear normal.  Left Ear: External ear normal.  Mouth/Throat: Oropharynx  is clear and moist. No oropharyngeal exudate.  Eyes: Conjunctivae and EOM are normal. Pupils are equal, round, and reactive to light. Right eye exhibits no discharge. Left eye exhibits no discharge.  Neck: Normal range of motion.  No meningismus or nuchal rigidity  Cardiovascular: Normal rate, regular rhythm and normal heart sounds.   Pulmonary/Chest: Effort normal and breath sounds normal.  Abdominal: Soft. There is no tenderness.  Musculoskeletal: Normal range of motion. He exhibits no edema or tenderness.  Neurological: He is  alert and oriented to person, place, and time.  Cranial nerves III through XII grossly intact. Motor strength is 5/5 in all 4 extremities and sensation is intact to light touch. Completes finger to nose coordination movements without difficulty. No nystagmus. Gait is baseline without ataxia.  Skin: No rash noted. He is not diaphoretic.  Psychiatric: He has a normal mood and affect. His behavior is normal. Judgment and thought content normal.  Nursing note and vitals reviewed.    ED Treatments / Results  Labs (all labs ordered are listed, but only abnormal results are displayed) Labs Reviewed  BASIC METABOLIC PANEL - Abnormal; Notable for the following:       Result Value   Glucose, Bld 129 (*)    All other components within normal limits  CBC WITH DIFFERENTIAL/PLATELET - Abnormal; Notable for the following:    MCHC 36.1 (*)    All other components within normal limits    EKG  EKG Interpretation None       Radiology Ct Head Wo Contrast  Result Date: 06/30/2016 CLINICAL DATA:  Headache for 4 days.  Dizziness. EXAM: CT HEAD WITHOUT CONTRAST TECHNIQUE: Contiguous axial images were obtained from the base of the skull through the vertex without intravenous contrast. COMPARISON:  None. FINDINGS: Brain: No acute intracranial abnormality. Specifically, no hemorrhage, hydrocephalus, mass lesion, acute infarction, or significant intracranial injury. Vascular: No hyperdense vessel or unexpected calcification. Skull: No acute calvarial abnormality Sinuses/Orbits: Visualized paranasal sinuses and mastoids clear. Orbital soft tissues unremarkable. Other: None IMPRESSION: No intracranial abnormality. Electronically Signed   By: Charlett NoseKevin  Dover M.D.   On: 06/30/2016 12:45    Procedures Procedures (including critical care time)  Medications Ordered in ED Medications  meclizine (ANTIVERT) tablet 25 mg (not administered)     Initial Impression / Assessment and Plan / ED Course  I have reviewed  the triage vital signs and the nursing notes.  Pertinent labs & imaging results that were available during my care of the patient were reviewed by me and considered in my medical decision making (see chart for details).  Clinical Course    Patient with multiple complaints ongoing over the past 2-1/2 weeks. He is hemodynamically stable here, has a reassuring physical exam. Due to persistent headache and dizziness, we will obtain head CT. Pending screening labs. Anticipate discharge with referral to primary care and Prescription for meclizine. Return precautions discussed. He verbalizes understanding, agrees with plan and subsequent discharge.  Final Clinical Impressions(s) / ED Diagnoses   Final diagnoses:  Hypertension, unspecified type  Dizzy spells    New Prescriptions Discharge Medication List as of 06/30/2016  1:13 PM    START taking these medications   Details  meclizine (ANTIVERT) 25 MG tablet Take 1 tablet (25 mg total) by mouth 3 (three) times daily as needed for dizziness., Starting Wed 06/30/2016, Print         Joycie PeekBenjamin Azavier Creson, PA-C 06/30/16 1550    Laurence Spatesachel Morgan Little, MD 06/30/16 [phone]

## 2016-06-30 NOTE — ED Triage Notes (Signed)
Patient complaining of headache x4 days. Hx of HTN and has been taking his medications as prescribed. Patient alert and speaking in full sentences. Oriented x4. Patient ambulatory.

## 2016-06-30 NOTE — ED Notes (Signed)
Patient unable to sign due to signature pad not working at this time.   Patient given detailed highlighted instructions via translator about follow up care for his arm pain, HTN and medications [facility]

## 2016-06-30 NOTE — Discharge Instructions (Signed)
There does not appear to be an emergent cause for your symptoms at this time. Your labs and CT scan of your head were reassuring.Please follow-up with your doctor this week for reevaluation. Continue taking your blood pressure medications. He may use your meclizine as prescribed for dizziness. Return to ED for any new or worsening symptoms as we discussed.

## 2016-09-12 ENCOUNTER — Emergency Department (HOSPITAL_COMMUNITY)
Admission: EM | Admit: 2016-09-12 | Discharge: 2016-09-12 | Disposition: A | Discharge status: Home / Self Care | Payer: Self-pay | Attending: Emergency Medicine | Admitting: Emergency Medicine

## 2016-09-12 ENCOUNTER — Emergency Department (HOSPITAL_COMMUNITY): Payer: Self-pay

## 2016-09-12 ENCOUNTER — Encounter (HOSPITAL_COMMUNITY): Payer: Self-pay | Admitting: Emergency Medicine

## 2016-09-12 DIAGNOSIS — G510 Bell's palsy: Secondary | ICD-10-CM | POA: Insufficient documentation

## 2016-09-12 DIAGNOSIS — Z7982 Long term (current) use of aspirin: Secondary | ICD-10-CM | POA: Insufficient documentation

## 2016-09-12 DIAGNOSIS — Z79899 Other long term (current) drug therapy: Secondary | ICD-10-CM | POA: Insufficient documentation

## 2016-09-12 DIAGNOSIS — I10 Essential (primary) hypertension: Secondary | ICD-10-CM | POA: Insufficient documentation

## 2016-09-12 LAB — I-STAT CHEM 8, ED
BUN: 9 mg/dL (ref 6–20)
CALCIUM ION: 0.89 mmol/L — AB (ref 1.15–1.40)
CREATININE: 0.6 mg/dL — AB (ref 0.61–1.24)
Chloride: 105 mmol/L (ref 101–111)
Glucose, Bld: 106 mg/dL — ABNORMAL HIGH (ref 65–99)
HEMATOCRIT: 46 % (ref 39.0–52.0)
Hemoglobin: 15.6 g/dL (ref 13.0–17.0)
Potassium: 5.2 mmol/L — ABNORMAL HIGH (ref 3.5–5.1)
SODIUM: 136 mmol/L (ref 135–145)
TCO2: 23 mmol/L (ref 0–100)

## 2016-09-12 MED ORDER — LISINOPRIL 20 MG PO TABS
20.0000 mg | ORAL_TABLET | Freq: Once | ORAL | Status: AC
  Administered 2016-09-12: 20 mg via ORAL
  Filled 2016-09-12: qty 1

## 2016-09-12 MED ORDER — PREDNISONE 20 MG PO TABS
60.0000 mg | ORAL_TABLET | Freq: Once | ORAL | Status: AC
  Administered 2016-09-12: 60 mg via ORAL
  Filled 2016-09-12: qty 3

## 2016-09-12 MED ORDER — LISINOPRIL 20 MG PO TABS: 20.0000 mg | ORAL_TABLET | Freq: Every day | ORAL | 0 refills | Status: DC

## 2016-09-12 MED ORDER — PREDNISONE 20 MG PO TABS: 20.0000 mg | ORAL_TABLET | Freq: Two times a day (BID) | ORAL | 0 refills | Status: DC

## 2016-09-12 MED ORDER — ARTIFICIAL TEARS OP OINT: TOPICAL_OINTMENT | Freq: Three times a day (TID) | OPHTHALMIC | 0 refills | Status: DC

## 2016-09-12 MED ORDER — PREDNISONE 20 MG PO TABS: ORAL_TABLET | ORAL | 0 refills | Status: DC

## 2016-09-12 NOTE — ED Provider Notes (Signed)
[facility] Provider Note   CSN: [id number] Arrival date & time: 09/12/16  1311     History   Chief Complaint Chief Complaint  Patient presents with  . Facial Droop    HPI [address]Xander Haney [place] Haney is a 46 y.o. male.  He presents with 48 hours of symptoms involving his left face. He states 2 days ago he felt some tingling in the left side of his face and that yesterday morning when he tried to drink it was hard and sometimes the liquid ran out of his mouth. Is not having coughing or choking. He noticed that his left eye was not closing well. He had some pressure behind his left ear. Today he noticed that things "tasted funny".  Occasional headache but not currently. The pressure by the ear is resolved. He has not had URI symptoms. Is not had ear or facial pain or blistering.  States he is supposed to blood pressure medication but states that it makes him dizzy. States especially when he drinks.    HPI  Past Medical History:  Diagnosis Date  . Hypertension     Patient Active Problem List   Diagnosis Date Noted  . Hypertension 01/29/2016  . Hypertensive urgency 01/29/2016    History reviewed. No pertinent surgical history.     Home Medications    Prior to Admission medications   Medication Sig Start Date End Date Taking? Authorizing Provider  ibuprofen (ADVIL,MOTRIN) 200 MG tablet Take 200-800 mg by mouth every 6 (six) hours as needed for moderate pain.   Yes Historical Provider, MD  artificial tears (LACRILUBE) OINT ophthalmic ointment Place into the left eye 3 (three) times daily. 09/12/16   Rolland PorterMark Penn Grissett, MD  aspirin EC 81 MG tablet Take 1 tablet (81 mg total) by mouth daily. Patient not taking: Reported on 09/12/2016 01/30/16   Leroy SeaPrashant K Singh, MD  carvedilol (COREG) 6.25 MG tablet Take 1 tablet (6.25 mg total) by mouth 2 (two) times daily with a meal. Patient not taking: Reported on 09/12/2016 01/30/16   Leroy SeaPrashant K Singh, MD  lisinopril (PRINIVIL,ZESTRIL) 20 MG  tablet Take 1 tablet (20 mg total) by mouth daily. 09/12/16   Rolland PorterMark Josedaniel Haye, MD  meclizine (ANTIVERT) 25 MG tablet Take 1 tablet (25 mg total) by mouth 3 (three) times daily as needed for dizziness. 06/30/16   Joycie PeekBenjamin Cartner, PA-C  predniSONE (DELTASONE) 20 MG tablet Take 1 tablet (20 mg total) by mouth 2 (two) times daily with a meal. 09/12/16   Rolland PorterMark Laruen Risser, MD  predniSONE (DELTASONE) 20 MG tablet 1 by mouth twice a day 5 days. Then one by mouth daily 5 days. 09/12/16   Rolland PorterMark Jamea Robicheaux, MD    Family History No family history on file.  Social History Social History  Substance Use Topics  . Smoking status: Never Smoker  . Smokeless tobacco: Never Used  . Alcohol use Yes     Allergies   Aspirin   Review of Systems Review of Systems  Constitutional: Negative for appetite change, chills, diaphoresis, fatigue and fever.  HENT: Negative for mouth sores, sore throat and trouble swallowing.   Eyes: Negative for visual disturbance.  Respiratory: Negative for cough, chest tightness, shortness of breath and wheezing.   Cardiovascular: Negative for chest pain.  Gastrointestinal: Negative for abdominal distention, abdominal pain, diarrhea, nausea and vomiting.  Endocrine: Negative for polydipsia, polyphagia and polyuria.  Genitourinary: Negative for dysuria, frequency and hematuria.  Musculoskeletal: Negative for gait problem.  Skin: Negative for color change, pallor  and rash.  Neurological: Positive for facial asymmetry and headaches. Negative for dizziness, syncope and light-headedness.  Hematological: Does not bruise/bleed easily.  Psychiatric/Behavioral: Negative for behavioral problems and confusion.     Physical Exam Updated Vital Signs BP (!) 203/127 (BP Location: Right Arm)   Pulse 85   Temp 99.2 F (37.3 C)   Resp 17   Ht 5\' 5"  (1.651 m)   SpO2 99%   Physical Exam  Constitutional: He is oriented to person, place, and time. He appears well-developed and well-nourished. No  distress.  HENT:  Head: Normocephalic.  Eyes: Conjunctivae are normal. Pupils are equal, round, and reactive to light. No scleral icterus.  Neck: Normal range of motion. Neck supple. No thyromegaly present.  Cardiovascular: Normal rate and regular rhythm.  Exam reveals no gallop and no friction rub.   No murmur heard. Pulmonary/Chest: Effort normal and breath sounds normal. No respiratory distress. He has no wheezes. He has no rales.  Abdominal: Soft. Bowel sounds are normal. He exhibits no distension. There is no tenderness. There is no rebound.  Musculoskeletal: Normal range of motion.  Neurological: He is alert and oriented to person, place, and time.  Patients with upper facial weakness. Incomplete closure of his lids. Positive Bell's phenomenon. Dr. movements are normal. He has lower facial droop. Normal tongue protrusion. No pronator drift. No leg drift. Normal gait. Remainder of cranial nerves are intact and symmetric  Skin: Skin is warm and dry. No rash noted.  Psychiatric: He has a normal mood and affect. His behavior is normal.     ED Treatments / Results  Labs (all labs ordered are listed, but only abnormal results are displayed) Labs Reviewed  I-STAT CHEM 8, ED - Abnormal; Notable for the following:       Result Value   Potassium 5.2 (*)    Creatinine, Ser 0.60 (*)    Glucose, Bld 106 (*)    Calcium, Ion 0.89 (*)    All other components within normal limits    EKG  EKG Interpretation None       Radiology Ct Head Wo Contrast  Result Date: 09/12/2016 CLINICAL DATA:  LEFT facial droop since yesterday at 1600 hours, frontal head pain rated 5/10, hypertension EXAM: CT HEAD WITHOUT CONTRAST TECHNIQUE: Contiguous axial images were obtained from the base of the skull through the vertex without intravenous contrast. COMPARISON:  06/30/2016 FINDINGS: Brain: Normal ventricular morphology. No midline shift or mass effect. Normal appearance of brain parenchyma. No intracranial  hemorrhage, mass lesion, evidence of acute infarction, or extra-axial fluid collection. Vascular:  Unremarkable Skull:  Intact Sinuses/Orbits: Small mucosal retention cysts at the maxillary sinuses Other:  N/A IMPRESSION: No acute intracranial abnormalities. Electronically Signed   By: Ulyses Southward M.D.   On: 09/12/2016 14:41    Procedures Procedures (including critical care time)  Medications Ordered in ED Medications  predniSONE (DELTASONE) tablet 60 mg (60 mg Oral Given 09/12/16 1424)  lisinopril (PRINIVIL,ZESTRIL) tablet 20 mg (20 mg Oral Given 09/12/16 1425)     Initial Impression / Assessment and Plan / ED Course  I have reviewed the triage vital signs and the nursing notes.  Pertinent labs & imaging results that were available during my care of the patient were reviewed by me and considered in my medical decision making (see chart for details).  Clinical Course     Normal head CT. Clinically and per exam this is the seventh nerve palsy with clear upper lower facial droop. Given by mouth  prednisone. Will be given a patch for his eye. We'll place him on lisinopril for his blood pressure is acid take this nightly. Creatinine normal. Neurology referral. Lacri-Lube drops.  Final Clinical Impressions(s) / ED Diagnoses   Final diagnoses:  Bell's palsy  Hypertension, unspecified type    New Prescriptions New Prescriptions   ARTIFICIAL TEARS (LACRILUBE) OINT OPHTHALMIC OINTMENT    Place into the left eye 3 (three) times daily.   LISINOPRIL (PRINIVIL,ZESTRIL) 20 MG TABLET    Take 1 tablet (20 mg total) by mouth daily.   PREDNISONE (DELTASONE) 20 MG TABLET    Take 1 tablet (20 mg total) by mouth 2 (two) times daily with a meal.   PREDNISONE (DELTASONE) 20 MG TABLET    1 by mouth twice a day 5 days. Then one by mouth daily 5 days.     Rolland Porter, MD 09/12/16 [phone]

## 2016-09-12 NOTE — ED Triage Notes (Addendum)
Pt from home with complaints of left sided facial droop that began yesterday at 1600. Pt also has complaints of frontal head pain that he rates at 5/10/ Pt reports noncompliance with his bp meds because when he drinks etoh on his meds, he feels dizzy. Pt also reports "feeling his heart beat more" than normal. Pt denies chest pain. EKG done in triage. EDP made aware

## 2016-09-12 NOTE — Discharge Instructions (Signed)
Put drops in your eye 3 times per day. Wear the patch at night to keep her eye closed. Take lisinopril as prescribed. Takeat night to avoid dizziness.

## 2016-09-12 NOTE — ED Notes (Signed)
Patient transported to CT 

## 2016-09-20 ENCOUNTER — Encounter (HOSPITAL_COMMUNITY): Payer: Self-pay | Admitting: Emergency Medicine

## 2016-09-20 ENCOUNTER — Emergency Department (HOSPITAL_COMMUNITY)
Admission: EM | Admit: 2016-09-20 | Discharge: 2016-09-21 | Disposition: A | Discharge status: Home / Self Care | Payer: Self-pay | Attending: Emergency Medicine | Admitting: Emergency Medicine

## 2016-09-20 DIAGNOSIS — G510 Bell's palsy: Secondary | ICD-10-CM | POA: Insufficient documentation

## 2016-09-20 DIAGNOSIS — Z7982 Long term (current) use of aspirin: Secondary | ICD-10-CM | POA: Insufficient documentation

## 2016-09-20 DIAGNOSIS — Z79899 Other long term (current) drug therapy: Secondary | ICD-10-CM | POA: Insufficient documentation

## 2016-09-20 DIAGNOSIS — I10 Essential (primary) hypertension: Secondary | ICD-10-CM | POA: Insufficient documentation

## 2016-09-20 DIAGNOSIS — F419 Anxiety disorder, unspecified: Secondary | ICD-10-CM | POA: Insufficient documentation

## 2016-09-20 LAB — URINALYSIS, ROUTINE W REFLEX MICROSCOPIC
BACTERIA UA: NONE SEEN
Bilirubin Urine: NEGATIVE
Glucose, UA: NEGATIVE mg/dL
Ketones, ur: NEGATIVE mg/dL
Leukocytes, UA: NEGATIVE
Nitrite: NEGATIVE
PH: 6 (ref 5.0–8.0)
Protein, ur: NEGATIVE mg/dL
SPECIFIC GRAVITY, URINE: 1.003 — AB (ref 1.005–1.030)
Squamous Epithelial / LPF: NONE SEEN

## 2016-09-20 LAB — BASIC METABOLIC PANEL
ANION GAP: 9 (ref 5–15)
BUN: 16 mg/dL (ref 6–20)
CO2: 22 mmol/L (ref 22–32)
Calcium: 8.4 mg/dL — ABNORMAL LOW (ref 8.9–10.3)
Chloride: 107 mmol/L (ref 101–111)
Creatinine, Ser: 0.67 mg/dL (ref 0.61–1.24)
GFR calc Af Amer: >60 mL/min (ref >60)
GFR calc non Af Amer: >60 mL/min (ref >60)
GLUCOSE: 102 mg/dL — AB (ref 65–99)
Potassium: 3.9 mmol/L (ref 3.5–5.1)
Sodium: 138 mmol/L (ref 135–145)

## 2016-09-20 LAB — CBC
HEMATOCRIT: 46 % (ref 39.0–52.0)
HEMOGLOBIN: 16.3 g/dL (ref 13.0–17.0)
MCH: 31 pg (ref 26.0–34.0)
MCHC: 35.4 g/dL (ref 30.0–36.0)
MCV: 87.6 fL (ref 78.0–100.0)
Platelets: 245 10*3/uL (ref 150–400)
RBC: 5.25 MIL/uL (ref 4.22–5.81)
RDW: 13 % (ref 11.5–15.5)
WBC: 11.1 10*3/uL — ABNORMAL HIGH (ref 4.0–10.5)

## 2016-09-20 LAB — CBG MONITORING, ED: GLUCOSE-CAPILLARY: 111 mg/dL — AB (ref 65–99)

## 2016-09-20 NOTE — ED Triage Notes (Signed)
Pt verbalizes dizziness worsening yesterday. Pt verbalizes "was told left side of face would work again in a week but it still isn't." Pt diagnosed with Bell Palsy 1/14.

## 2016-09-20 NOTE — ED Provider Notes (Signed)
[facility]-EMERGENCY DEPT Provider Note   CSN: [id number] Arrival date & time: 09/20/16  1326 By signing my name below, I, Bridgette HabermannMaria Tan, attest that this documentation has been prepared under the direction and in the presence of Tomasita CrumbleAdeleke Ymani Porcher, MD. Electronically Signed: Bridgette HabermannMaria Tan, ED Scribe. 09/21/16. 12:05 AM.  History   Chief Complaint Chief Complaint  Patient presents with  . Dizziness    HPI The history is provided by the patient. No language interpreter was used.   HPI Comments: Cameron Haney is a 46 y.o. male with h/o HTN and Bell's palsy, who presents to the Emergency Department complaining of worsening dizziness beginning yesterday. Pt was seen here last week for left-sided facial weakness and diagnosed with Bell's palsy. He was placed on Prednisone for this and Lisinopril for his hypertension. Pt presents today because he ran out of his Prednisone but states his left-sided facial weakness persists; he was told his symptoms would be resolved within this week. He was also given medication for his nausea which he states has not been helping him and caused him to be drowsy. Pt has an appointment with a neurologist for follow-up on 10/04/16.  Additionally, pt is also complaining of increased anxiety beginning a week ago. Pt has h/o anxiety which he notes has seemed to worsen the past couple of days, particularly at night. Pt is currently requesting medication for this. He [place] but notes he was followed by a doctor in [place] who prescribed him Rivotril which he states significantly helped him.   Pt denies fever, chills, or any other associated symptoms.  Past Medical History:  Diagnosis Date  . Hypertension     Patient Active Problem List   Diagnosis Date Noted  . Hypertension 01/29/2016  . Hypertensive urgency 01/29/2016    History reviewed. No pertinent surgical history.     Home Medications    Prior to Admission  medications   Medication Sig Start Date End Date Taking? Authorizing Provider  ibuprofen (ADVIL,MOTRIN) 200 MG tablet Take 200-800 mg by mouth every 6 (six) hours as needed for moderate pain.   Yes Historical Provider, MD  lisinopril (PRINIVIL,ZESTRIL) 20 MG tablet Take 1 tablet (20 mg total) by mouth daily. 09/12/16  Yes Rolland PorterMark James, MD  meclizine (ANTIVERT) 25 MG tablet Take 1 tablet (25 mg total) by mouth 3 (three) times daily as needed for dizziness. 06/30/16  Yes Benjamin Cartner, PA-C  predniSONE (DELTASONE) 20 MG tablet 1 by mouth twice a day 5 days. Then one by mouth daily 5 days. 09/12/16  Yes Rolland PorterMark James, MD  artificial tears (LACRILUBE) OINT ophthalmic ointment Place into the left eye 3 (three) times daily. Patient not taking: Reported on 09/20/2016 09/12/16   Rolland PorterMark James, MD  aspirin EC 81 MG tablet Take 1 tablet (81 mg total) by mouth daily. Patient not taking: Reported on 09/12/2016 01/30/16   Leroy SeaPrashant K Singh, MD  carvedilol (COREG) 6.25 MG tablet Take 1 tablet (6.25 mg total) by mouth 2 (two) times daily with a meal. Patient not taking: Reported on 09/20/2016 01/30/16   Leroy SeaPrashant K Singh, MD  predniSONE (DELTASONE) 20 MG tablet Take 1 tablet (20 mg total) by mouth 2 (two) times daily with a meal. Patient not taking: Reported on 09/20/2016 09/12/16   Rolland PorterMark James, MD    Family History No family history on file.  Social History Social History  Substance Use Topics  . Smoking status: Never Smoker  .  Smokeless tobacco: Never Used  . Alcohol use Yes     Allergies   Aspirin   Review of Systems Review of Systems  Constitutional: Negative for chills and fever.  Neurological: Positive for dizziness, facial asymmetry and weakness.  Psychiatric/Behavioral: The patient is nervous/anxious.   All other systems reviewed and are negative.    Physical Exam Updated Vital Signs BP 171/97   Pulse 94   Temp 99.8 F (37.7 C) (Oral)   Resp 18   SpO2 100%   Physical Exam  Constitutional: He  is oriented to person, place, and time. Vital signs are normal. He appears well-developed and well-nourished.  Non-toxic appearance. He does not appear ill. No distress.  HENT:  Head: Normocephalic and atraumatic.  Nose: Nose normal.  Mouth/Throat: Oropharynx is clear and moist. No oropharyngeal exudate.  Eyes: Conjunctivae and EOM are normal. Pupils are equal, round, and reactive to light. No scleral icterus.  Neck: Normal range of motion. Neck supple. No tracheal deviation, no edema, no erythema and normal range of motion present. No thyroid mass and no thyromegaly present.  Cardiovascular: Normal rate, regular rhythm, S1 normal, S2 normal, normal heart sounds, intact distal pulses and normal pulses.  Exam reveals no gallop and no friction rub.   No murmur heard. Pulmonary/Chest: Effort normal and breath sounds normal. No respiratory distress. He has no wheezes. He has no rhonchi. He has no rales.  Abdominal: Soft. Normal appearance and bowel sounds are normal. He exhibits no distension, no ascites and no mass. There is no hepatosplenomegaly. There is no tenderness. There is no rebound, no guarding and no CVA tenderness.  Musculoskeletal: Normal range of motion. He exhibits no edema or tenderness.  Lymphadenopathy:    He has no cervical adenopathy.  Neurological: He is alert and oriented to person, place, and time. He has normal strength. A cranial nerve deficit is present. No sensory deficit.  Normal strength and sensation in all extremities. Normal cerebellar testing. Weakness of the left facial nerve. Weakness of the forehead is included.   Skin: Skin is warm, dry and intact. No petechiae and no rash noted. He is not diaphoretic. No erythema. No pallor.  Nursing note and vitals reviewed.    ED Treatments / Results  DIAGNOSTIC STUDIES: Oxygen Saturation is 100% on RA, normal by my interpretation.    COORDINATION OF CARE: 12:05 AM Discussed treatment plan with pt at bedside and pt agreed  to plan.  Labs (all labs ordered are listed, but only abnormal results are displayed) Labs Reviewed  BASIC METABOLIC PANEL - Abnormal; Notable for the following:       Result Value   Glucose, Bld 102 (*)    Calcium 8.4 (*)    All other components within normal limits  CBC - Abnormal; Notable for the following:    WBC 11.1 (*)    All other components within normal limits  URINALYSIS, ROUTINE W REFLEX MICROSCOPIC - Abnormal; Notable for the following:    Color, Urine COLORLESS (*)    Specific Gravity, Urine 1.003 (*)    Hgb urine dipstick SMALL (*)    All other components within normal limits  CBG MONITORING, ED - Abnormal; Notable for the following:    Glucose-Capillary 111 (*)    All other components within normal limits    EKG  EKG Interpretation  Date/Time:  Monday September 20 2016 13:58:21 EST Ventricular Rate:  79 PR Interval:    QRS Duration: 78 QT Interval:  376 QTC Calculation: 431  R Axis:   45 Text Interpretation:  Sinus rhythm Right atrial enlargement LVH with secondary repolarization abnormality ST depr, consider ischemia, inferior leads Anterior ST elevation, probably due to LVH Confirmed by Ranae Palms  MD, DAVID ([id number]) on 09/20/2016 2:04:11 PM       Radiology No results found.  Procedures Procedures (including critical care time)  Medications Ordered in ED Medications - No data to display   Initial Impression / Assessment and Plan / ED Course  I have reviewed the triage vital signs and the nursing notes.  Pertinent labs & imaging results that were available during my care of the patient were reviewed by me and considered in my medical decision making (see chart for details).     Patient presents to the ED for refill of his prednisone as he continues to have facial weakness.  My exam also confirms bells palsy.  The weakness includes the forehead.  He is also requesting clonazepam which helps with his anxiety.  Will give Rx for 5 tabs.  He already has a  fu appointment with neurology.  Remainder of exam is normal.  He appears well and in NAD.  VS remain within his normal limits and he is safe for DC.   Final Clinical Impressions(s) / ED Diagnoses   Final diagnoses:  None    New Prescriptions New Prescriptions   No medications on file      I personally performed the services described in this documentation, which was scribed in my presence. The recorded information has been reviewed and is accurate.       Tomasita Crumble, MD 09/21/16 [phone]

## 2016-09-20 NOTE — ED Notes (Signed)
Called pt for traige vitals no response.

## 2016-09-20 NOTE — ED Notes (Signed)
[facility] pt status and complaint; verbalizes no additional orders needed at present time.

## 2016-09-21 MED ORDER — PREDNISONE 20 MG PO TABS: 60.0000 mg | ORAL_TABLET | Freq: Every day | ORAL | 0 refills | Status: DC

## 2016-09-21 MED ORDER — CLONAZEPAM 1 MG PO TABS: 1.0000 mg | ORAL_TABLET | Freq: Every evening | ORAL | 0 refills | Status: DC | PRN

## 2016-09-21 NOTE — ED Notes (Signed)
Discharged information reviewed. Pt verbalized understanding of prescriptions and follow up care.

## 2016-10-04 ENCOUNTER — Encounter: Payer: Self-pay | Admitting: Diagnostic Neuroimaging

## 2016-10-04 ENCOUNTER — Ambulatory Visit (INDEPENDENT_AMBULATORY_CARE_PROVIDER_SITE_OTHER): Payer: Self-pay | Admitting: Diagnostic Neuroimaging

## 2016-10-04 VITALS — BP 150/88 | HR 84 | Wt 215.2 lb

## 2016-10-04 DIAGNOSIS — G510 Bell's palsy: Secondary | ICD-10-CM

## 2016-10-04 DIAGNOSIS — I10 Essential (primary) hypertension: Secondary | ICD-10-CM

## 2016-10-04 DIAGNOSIS — F419 Anxiety disorder, unspecified: Secondary | ICD-10-CM

## 2016-10-04 NOTE — Progress Notes (Signed)
[facility]  PATIENT: Cameron Haney DOB: 1971-04-07  REFERRING CLINICIAN: ER  HISTORY FROM: patient and wife (via interpreter) REASON FOR VISIT: new consult    HISTORICAL  CHIEF COMPLAINT:  Chief Complaint  Patient presents with  . Headache    rm 6, ED FU , wife- Lauris Poag, interpreter- Arta Bruce, "diagnosed with Bells Palsey ,panic attacks"  . Numbness    HISTORY OF PRESENT ILLNESS:   46 year old male here for evaluation of Bell's palsy, anxiety, dizziness.  Approximate one month ago patient had onset of left ear pain. Within 1-2 days he had left facial weakness. Patient went to the emergency room on 09/12/16, was diagnosed with Bell's palsy and treated with prednisone. He will return to the emergency room on 09/20/16 for persistent symptoms and prednisone course was renewed. Since that time symptoms have essentially resolved. Left facial weakness has 90% return to normal. Patient has a couple more days of prednisone course left to complete.  However patient has other complaints including different problems with anxiety and interrupted sleep. He reports being treated by a neurologist and psychiatrist in [place] around 2005. At that time he was prescribed clonazepam and then Paxil seemed to help a little bit. Patient now living in [place], but does not have primary care physician or psychiatrist to help him with his general issues.  He was noted to have hypertension in multiple emergency room visits, encouraged to [facility], but has not been able to do this yet.  Patient also reports 3 months of right arm weakness and right triceps muscle atrophy. He reports some neck pain issues.  Patient does not have insurance currently. He is planning to get insurance coverage starting on 10/28/2016.   REVIEW OF SYSTEMS: Full 14 system review of systems performed and negative with exception of: Aching  muscles depression anxiety not asleep hallucinations memory loss confusion headache blurred vision double vision fatigue palpitations insomnia snoring passing out.  ALLERGIES: Allergies  Allergen Reactions  . Aspirin Other (See Comments)    dizzy    HOME MEDICATIONS: Outpatient Medications Prior to Visit  Medication Sig Dispense Refill  . carvedilol (COREG) 6.25 MG tablet Take 1 tablet (6.25 mg total) by mouth 2 (two) times daily with a meal. 60 tablet 0  . clonazePAM (KLONOPIN) 1 MG tablet Take 1 tablet (1 mg total) by mouth at bedtime as needed for anxiety. 5 tablet 0  . ibuprofen (ADVIL,MOTRIN) 200 MG tablet Take 200-800 mg by mouth every 6 (six) hours as needed for moderate pain.    Marland Kitchen lisinopril (PRINIVIL,ZESTRIL) 20 MG tablet Take 1 tablet (20 mg total) by mouth daily. 30 tablet 0  . predniSONE (DELTASONE) 20 MG tablet Take 3 tablets (60 mg total) by mouth daily. 1 by mouth twice a day 5 days. Then one by mouth daily 5 days. 15 tablet 0   No facility-administered medications prior to visit.     PAST MEDICAL HISTORY: Past Medical History:  Diagnosis Date  . Hypertension     PAST SURGICAL HISTORY: History reviewed. No pertinent surgical history.  FAMILY HISTORY: History reviewed. No pertinent family history.  SOCIAL HISTORY:  Social History   Social History  . Marital status: Married    Spouse name: Lauris Poag  . Number of children: 3  . Years of education: 12   Occupational History  .      [employer]   Social History Main Topics  .  Smoking status: Former Smoker    Quit date: 09/03/2001  . Smokeless tobacco: Never Used  . Alcohol use Yes     Comment: 3 Bud Light beers daily, last used 09/13/16  . Drug use: No  . Sexual activity: Not on file   Other Topics Concern  . 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  Visual Acuity Screening   Right eye Left eye Both eyes  Without correction: 20/40 20/30   With correction:        Patient is in no distress; well developed, nourished and groomed; neck is supple  CARDIOVASCULAR:  Examination of carotid arteries is normal; no carotid bruits  Regular rate and rhythm, no murmurs  Examination of peripheral vascular system by observation and palpation is normal  EYES:  Ophthalmoscopic exam of optic discs and posterior segments is normal; no papilledema or hemorrhages  MUSCULOSKELETAL:  Gait, strength, tone, movements noted in Neurologic exam below  NEUROLOGIC: MENTAL STATUS:  No flowsheet data found.  awake, alert, oriented to person, place and time  recent and remote memory intact  normal attention and concentration  language fluent, comprehension intact, naming intact,   fund of knowledge appropriate  CRANIAL NERVE:   2nd - no papilledema on fundoscopic exam  2nd, 3rd, 4th, 6th - pupils equal and reactive to light, visual fields full to confrontation, extraocular muscles intact, no nystagmus  5th - facial sensation symmetric  7th - facial strength symmetric  8th - hearing intact  9th - palate elevates symmetrically, uvula midline  11th - shoulder shrug symmetric  12th - tongue protrusion midline  MOTOR:   normal bulk and tone, full strength in the BUE, BLE  SENSORY:   normal and symmetric to light touch, temperature, vibration  COORDINATION:   finger-nose-finger, fine finger movements normal  REFLEXES:   deep tendon reflexes present and symmetric  GAIT/STATION:   narrow based gait     DIAGNOSTIC DATA (LABS, IMAGING, TESTING) - I reviewed patient records, labs, notes, testing and imaging myself where available.  Lab Results  Component Value Date    WBC 11.1 (H) 09/20/2016   HGB 16.3 09/20/2016   HCT 46.0 09/20/2016   MCV 87.6 09/20/2016   PLT 245 09/20/2016      Component Value Date/Time   NA 138 09/20/2016 1401   K 3.9 09/20/2016 1401   CL 107 09/20/2016 1401   CO2 22 09/20/2016 1401   GLUCOSE 102 (H) 09/20/2016 1401   BUN 16 09/20/2016 1401   CREATININE 0.67 09/20/2016 1401   CALCIUM 8.4 (L) 09/20/2016 1401   GFRNONAA >60 09/20/2016 1401   GFRAA >60 09/20/2016 1401   No results found for: CHOL, HDL, LDLCALC, LDLDIRECT, TRIG, CHOLHDL No results found for: IONG2XHGBA1C No results found for: VITAMINB12 No results found for: TSH   09/12/16 CT head [I reviewed images myself and agree with interpretation. -VRP]  - No acute intracranial abnormalities.        ASSESSMENT AND PLAN  46 y.o. year old male here with probable Bell's palsy in early January 2018, now resolved after 2 courses of prednisone. No further treatment recommended for Bell's palsy  at this time.  Patient also has 2 general issues including anxiety and hypertension. I recommend that he established with PCP as soon as possible for appropriate evaluation and treatment of these issues. He may benefit from a psychiatrist evaluation as well for his anxiety disorder.   Dx:  1. Bell's palsy   2. Hypertension, unspecified type   3. Anxiety disorder, unspecified type      PLAN: - complete prednisone course for bell's palsy, then stop - establish with PCP to treat hypertension and anxiety  Return if symptoms worsen or fail to improve, for establish with PCP [facility]).    Suanne Marker, MD 10/04/2016, 11:47 AM Certified in Neurology, Neurophysiology and Neuroimaging  [facility] [address] [place], [place] [address] [phone]

## 2016-10-11 ENCOUNTER — Telehealth: Payer: Self-pay | Admitting: Diagnostic Neuroimaging

## 2016-10-11 NOTE — Telephone Encounter (Signed)
Called Lonn GeorgiaFransico  [phone], [place] language line to interpret for patient. Was on phone with interpreter and patient x 30 min. Orlene ErmConversation was very similar to conversation on 10/04/16 when he saw Dr Marjory LiesPenumalli for initial evaluation. Patient continued to ask for medication for his blood pressure and for clonopin. Through interpreter, continued to remind patient that he needs to call and establish with PCP. Reminded him that he and his wife were [facility] on 10/04/16. Advised that Dr Marjory LiesPenumalli is a neurologist, saw him, evaluated him for Bells Palsy and headaches as was the referral [facility]. Repeated Dr Richrd HumblesPenumalli's office note assessment/plan from 10/04/16.  Informed patient that Dr Marjory LiesPenumalli stated he had 90% resolution of Bells Palsy, needed to complete his Prednisone, and no other treatment was necessary.  [facility]. He asked if he should "wait for this medicine". Advised him it was prescribed on 09/12/16 by Dr [facility]. Again, through interpreter, emphasized that Dr Marjory LiesPenumalli does not manage hypertension, that patient has been instructed to establish with primary care dr to manage his hypertension and other health issues. Through interpreter advised patient again to call one of those offices today to get appointment. Interpreter advised that patient stated "thank you for your time" and ended the call.

## 2016-10-11 NOTE — Telephone Encounter (Signed)
Patient called office in reference to clonazePAM (KLONOPIN) 1 MG tablet.  Patient is requesting information as to why he did not receive a refill for this medication (per patient requesting a spanish speaking person to speak with him).  Please call

## 2016-10-13 ENCOUNTER — Encounter (HOSPITAL_COMMUNITY): Payer: Self-pay

## 2016-10-13 ENCOUNTER — Emergency Department (HOSPITAL_COMMUNITY): Payer: Self-pay

## 2016-10-13 ENCOUNTER — Emergency Department (HOSPITAL_COMMUNITY)
Admission: EM | Admit: 2016-10-13 | Discharge: 2016-10-13 | Disposition: A | Discharge status: Home / Self Care | Payer: Self-pay | Attending: Emergency Medicine | Admitting: Emergency Medicine

## 2016-10-13 DIAGNOSIS — R42 Dizziness and giddiness: Secondary | ICD-10-CM | POA: Insufficient documentation

## 2016-10-13 DIAGNOSIS — R791 Abnormal coagulation profile: Secondary | ICD-10-CM | POA: Insufficient documentation

## 2016-10-13 DIAGNOSIS — I10 Essential (primary) hypertension: Secondary | ICD-10-CM | POA: Insufficient documentation

## 2016-10-13 DIAGNOSIS — Z79899 Other long term (current) drug therapy: Secondary | ICD-10-CM | POA: Insufficient documentation

## 2016-10-13 DIAGNOSIS — Z87891 Personal history of nicotine dependence: Secondary | ICD-10-CM | POA: Insufficient documentation

## 2016-10-13 LAB — COMPREHENSIVE METABOLIC PANEL
ALBUMIN: 4.1 g/dL (ref 3.5–5.0)
ALT: 59 U/L (ref 17–63)
ANION GAP: 9 (ref 5–15)
AST: 39 U/L (ref 15–41)
Alkaline Phosphatase: 73 U/L (ref 38–126)
BUN: 9 mg/dL (ref 6–20)
CALCIUM: 9 mg/dL (ref 8.9–10.3)
CHLORIDE: 103 mmol/L (ref 101–111)
CO2: 24 mmol/L (ref 22–32)
Creatinine, Ser: 0.74 mg/dL (ref 0.61–1.24)
GFR calc non Af Amer: >60 mL/min (ref >60)
Glucose, Bld: 100 mg/dL — ABNORMAL HIGH (ref 65–99)
POTASSIUM: 3.7 mmol/L (ref 3.5–5.1)
SODIUM: 136 mmol/L (ref 135–145)
Total Bilirubin: 1.1 mg/dL (ref 0.3–1.2)
Total Protein: 7.3 g/dL (ref 6.5–8.1)

## 2016-10-13 LAB — DIFFERENTIAL
BASOS PCT: 0 %
Basophils Absolute: 0 10*3/uL (ref 0.0–0.1)
EOS ABS: 0.2 10*3/uL (ref 0.0–0.7)
EOS PCT: 2 %
Lymphocytes Relative: 32 %
Lymphs Abs: 3.1 10*3/uL (ref 0.7–4.0)
MONO ABS: 0.6 10*3/uL (ref 0.1–1.0)
Monocytes Relative: 6 %
NEUTROS PCT: 60 %
Neutro Abs: 5.9 10*3/uL (ref 1.7–7.7)

## 2016-10-13 LAB — CBC
HCT: 43.4 % (ref 39.0–52.0)
Hemoglobin: 15.3 g/dL (ref 13.0–17.0)
MCH: 31.4 pg (ref 26.0–34.0)
MCHC: 35.3 g/dL (ref 30.0–36.0)
MCV: 88.9 fL (ref 78.0–100.0)
PLATELETS: 201 10*3/uL (ref 150–400)
RBC: 4.88 MIL/uL (ref 4.22–5.81)
RDW: 12.6 % (ref 11.5–15.5)
WBC: 9.7 10*3/uL (ref 4.0–10.5)

## 2016-10-13 LAB — CBG MONITORING, ED: GLUCOSE-CAPILLARY: 101 mg/dL — AB (ref 65–99)

## 2016-10-13 LAB — I-STAT CHEM 8, ED
BUN: 11 mg/dL (ref 6–20)
CALCIUM ION: 1.07 mmol/L — AB (ref 1.15–1.40)
Chloride: 102 mmol/L (ref 101–111)
Creatinine, Ser: 0.7 mg/dL (ref 0.61–1.24)
Glucose, Bld: 99 mg/dL (ref 65–99)
HCT: 46 % (ref 39.0–52.0)
HEMOGLOBIN: 15.6 g/dL (ref 13.0–17.0)
Potassium: 3.8 mmol/L (ref 3.5–5.1)
SODIUM: 138 mmol/L (ref 135–145)
TCO2: 25 mmol/L (ref 0–100)

## 2016-10-13 LAB — I-STAT TROPONIN, ED: Troponin i, poc: 0.01 ng/mL (ref 0.00–0.08)

## 2016-10-13 LAB — APTT: aPTT: 33 seconds (ref 24–36)

## 2016-10-13 LAB — PROTIME-INR
INR: 1
PROTHROMBIN TIME: 13.1 s (ref 11.4–15.2)

## 2016-10-13 MED ORDER — CLONAZEPAM 1 MG PO TABS: 1.0000 mg | ORAL_TABLET | Freq: Every evening | ORAL | 0 refills | Status: DC | PRN

## 2016-10-13 MED ORDER — LISINOPRIL 20 MG PO TABS: 20.0000 mg | ORAL_TABLET | Freq: Every day | ORAL | 0 refills | Status: DC

## 2016-10-13 MED ORDER — CARVEDILOL 6.25 MG PO TABS: 6.2500 mg | ORAL_TABLET | Freq: Two times a day (BID) | ORAL | 0 refills | Status: DC

## 2016-10-13 NOTE — ED Triage Notes (Signed)
Triage completed using [place] interpreter line. Pt states he was feeling bad at work yesterday and was told he had elevated BP. Pt reports he feels confused today. Pt also states he has felt dizzy all day. Pt is alert and oriented X4. Pt also reports chest discomfort.

## 2016-10-13 NOTE — ED Provider Notes (Signed)
Patient not in room   Cameron Grizzleanielle Tyaisha Cullom, MD 10/13/16 [phone]

## 2016-10-13 NOTE — ED Provider Notes (Signed)
[facility]-EMER[facility]Y DEPT Provider Note   CSN: [id number] Arrival date & time: 10/13/16  1551     History   Chief Complaint Chief Complaint  Patient presents with  . Hypertension    [name] is a 46 y.o. male. Level V caveat secondary to language barrier. History obtained through interpreter HPI 46 year old man with a history of hypertension who presents today concerned with hypertension. He states that he has been dizzy intermittently over the past several months. He had a Bell's palsy which was treated by a neurologist. He has been taking the lisinopril daily but stopped his Coreg as he thought it made him dizzy. He is unable to describe the dizziness to me. He states that he no longer thinks that made him dizzy. However, he is almost out of it has not restarted it. He states that he was seen at work yesterday and his blood pressure was noticed to be 220. He was told to go to the clinic. He states he also thinks that he is anxious secondary to deportation problems. He was started on some medicine clinic but is not sure that he can take it with his blood pressure medications. Drinking alcohol when he began having a Bell's palsy. He is not a smoker. He states he has very little salt to his food.  Past Medical History:  Diagnosis Date  . Hypertension     Patient Active Problem List   Diagnosis Date Noted  . Hypertension 01/29/2016  . Hypertensive urgency 01/29/2016    History reviewed. No pertinent surgical history.     Home Medications    Prior to Admission medications   Medication Sig Start Date End Date Taking? Authorizing Provider  Bismuth Subsalicylate (PEPTO-BISMOL PO) Take 15-30 mLs by mouth every 6 (six) hours as needed (for diarrhea).   Yes Historical Provider, MD  escitalopram (LEXAPRO) 20 MG tablet Take 20 mg by mouth daily.   Yes Historical Provider, MD  ibuprofen (ADVIL,MOTRIN) 200 MG tablet Take 200-800 mg by mouth every 6 (six) hours as needed for  moderate pain.   Yes Historical Provider, MD  lisinopril (PRINIVIL,ZESTRIL) 20 MG tablet Take 1 tablet (20 mg total) by mouth daily. 09/12/16  Yes Rolland PorterMark James, MD  NON FORMULARY Tribedoce Compuesto vitamin: Take 1 tablet by mouth once a day   Yes Historical Provider, MD  carvedilol (COREG) 6.25 MG tablet Take 1 tablet (6.25 mg total) by mouth 2 (two) times daily with a meal. Patient not taking: Reported on 10/13/2016 01/30/16   Leroy SeaPrashant K Singh, MD  clonazePAM (KLONOPIN) 1 MG tablet Take 1 tablet (1 mg total) by mouth at bedtime as needed for anxiety. Patient not taking: Reported on 10/13/2016 09/21/16   Tomasita CrumbleAdeleke Oni, MD  predniSONE (DELTASONE) 20 MG tablet Take 3 tablets (60 mg total) by mouth daily. 1 by mouth twice a day 5 days. Then one by mouth daily 5 days. Patient not taking: Reported on 10/13/2016 09/21/16   Tomasita CrumbleAdeleke Oni, MD    Family History History reviewed. No pertinent family history.  Social History Social History  Substance Use Topics  . Smoking status: Former Smoker    Quit date: 09/03/2001  . Smokeless tobacco: Never Used  . Alcohol use Yes     Comment: 3 Bud Light beers daily, last used 09/13/16     Allergies   Aspirin and Other   Review of Systems Review of Systems  All other systems reviewed and are negative.    Physical Exam Updated Vital  Signs BP 155/99   Pulse 76   Temp 100.3 F (37.9 C) (Oral)   Resp 21   SpO2 96%   Physical Exam  Constitutional: He is oriented to person, place, and time. He appears well-developed and well-nourished.  HENT:  Head: Normocephalic and atraumatic.  Right Ear: External ear normal.  Left Ear: External ear normal.  Nose: Nose normal.  Mouth/Throat: Oropharynx is clear and moist.  Eyes: Conjunctivae and EOM are normal. Pupils are equal, round, and reactive to light.  Neck: Normal range of motion. Neck supple.  Cardiovascular: Normal rate, regular rhythm, normal heart sounds and intact distal pulses.   Pulmonary/Chest: Effort  normal and breath sounds normal.  Abdominal: Soft. Bowel sounds are normal.  Musculoskeletal: Normal range of motion.  Neurological: He is alert and oriented to person, place, and time. He has normal reflexes. He displays normal reflexes. No cranial nerve deficit or sensory deficit. He exhibits normal muscle tone. Coordination normal.  Skin: Skin is warm and dry.  Psychiatric: He has a normal mood and affect. His behavior is normal. Judgment and thought content normal.  Nursing note and vitals reviewed.    ED Treatments / Results  Labs (all labs ordered are listed, but only abnormal results are displayed) Labs Reviewed  COMPREHENSIVE METABOLIC PANEL - Abnormal; Notable for the following:       Result Value   Glucose, Bld 100 (*)    All other components within normal limits  CBG MONITORING, ED - Abnormal; Notable for the following:    Glucose-Capillary 101 (*)    All other components within normal limits  I-STAT CHEM 8, ED - Abnormal; Notable for the following:    Calcium, Ion 1.07 (*)    All other components within normal limits  PROTIME-INR  APTT  CBC  DIFFERENTIAL  I-STAT TROPOININ, ED    EKG  EKG Interpretation  Date/Time:  Wednesday October 13 2016 16:21:12 EST Ventricular Rate:  76 PR Interval:  128 QRS Duration: 78 QT Interval:  384 QTC Calculation: 432 R Axis:   17 Text Interpretation:  Normal sinus rhythm Right atrial enlargement ST & T wave abnormality, consider lateral ischemia Abnormal ECG LVH with repolarization abnormality, seen on prior EKG 09/20/2016 and 01/29/2016 Confirmed by LIU MD, DANA [phone]) on 10/13/2016 4:28:04 PM       Radiology Ct Head Wo Contrast  Result Date: 10/13/2016 CLINICAL DATA:  Dizziness EXAM: CT HEAD WITHOUT CONTRAST TECHNIQUE: Contiguous axial images were obtained from the base of the skull through the vertex without intravenous contrast. COMPARISON:  None. FINDINGS: Brain: Ventricle size normal. Mild atrophy for age. Negative for  acute infarct, hemorrhage, mass. No edema or shift of the midline structures. Vascular: No hyperdense vessel or unexpected calcification. Skull: Negative Sinuses/Orbits: Mild mucosal edema in the paranasal sinuses. Mastoid sinus is clear. Normal orbital structures. Other: None IMPRESSION: Mild atrophy for age.  No focal or acute abnormality Mild mucosal edema in the paranasal sinuses. Electronically Signed   By: Marlan Palau M.D.   On: 10/13/2016 16:57    Procedures Procedures (including critical care time)  Medications Ordered in ED Medications - No data to display   Initial Impression / Assessment and Plan / ED Course  I have reviewed the triage vital signs and the nursing notes.  Pertinent labs & imaging results that were available during my care of the patient were reviewed by me and considered in my medical decision making (see chart for details).     This is  a 46 year old man with hypertension. He has stopped his Coreg. He appears to have long-standing hypertension with chronic EKG changes. There are no acute abnormalities noted here on his labs or his head CT. We have discussed the necessity of taking medications. We'll restart the Coreg. He requests prescriptions for both of his medications which will be filled. We have discussed diet and exercise that he is getting information on this. He is to follow-up in the next several days for blood pressure recheck. He voices understanding of need for follow-up and return precautions.  Final Clinical Impressions(s) / ED Diagnoses   Final diagnoses:  None    New Prescriptions New Prescriptions   No medications on file     Margarita Grizzle, MD 10/13/16 2026

## 2016-11-01 ENCOUNTER — Ambulatory Visit (INDEPENDENT_AMBULATORY_CARE_PROVIDER_SITE_OTHER): Payer: 59 | Admitting: Emergency Medicine

## 2016-11-01 ENCOUNTER — Telehealth: Payer: Self-pay | Admitting: Emergency Medicine

## 2016-11-01 VITALS — BP 146/82 | HR 80 | Temp 98.6°F | Ht 65.0 in | Wt 210.6 lb

## 2016-11-01 DIAGNOSIS — I10 Essential (primary) hypertension: Secondary | ICD-10-CM

## 2016-11-01 DIAGNOSIS — F411 Generalized anxiety disorder: Secondary | ICD-10-CM | POA: Insufficient documentation

## 2016-11-01 MED ORDER — CARVEDILOL 6.25 MG PO TABS: 6.2500 mg | ORAL_TABLET | Freq: Two times a day (BID) | ORAL | 0 refills | Status: DC

## 2016-11-01 MED ORDER — LISINOPRIL 20 MG PO TABS: 20.0000 mg | ORAL_TABLET | Freq: Every day | ORAL | 0 refills | Status: DC

## 2016-11-01 NOTE — Progress Notes (Signed)
Cameron Haney 46 y.o.   Chief Complaint  Patient presents with  . Annual Exam    CPE    HISTORY OF PRESENT ILLNESS: This is a 46 y.o. male with h/o HTN recently seen in the ER with hypertensive urgency; also diagnosed with anxiety; recently saw Psychiatrist and was started on Zoloft 25 mg; recently had Bell's palsy that is now almost 100% resolved. Asymptomatic at present time.  HPI   Prior to Admission medications   Medication Sig Start Date End Date Taking? Authorizing Provider  Bismuth Subsalicylate (PEPTO-BISMOL PO) Take 15-30 mLs by mouth every 6 (six) hours as needed (for diarrhea).   Yes Historical Provider, MD  carvedilol (COREG) 6.25 MG tablet Take 1 tablet (6.25 mg total) by mouth 2 (two) times daily with a meal. 11/01/16  Yes Cameron QuintMiguel Jose Yeison Sippel, MD  clonazePAM (KLONOPIN) 1 MG tablet Take 1 tablet (1 mg total) by mouth at bedtime as needed for anxiety. 10/13/16  Yes Margarita Grizzleanielle Ray, MD  escitalopram (LEXAPRO) 20 MG tablet Take 20 mg by mouth daily.   Yes Historical Provider, MD  ibuprofen (ADVIL,MOTRIN) 200 MG tablet Take 200-800 mg by mouth every 6 (six) hours as needed for moderate pain.   Yes Historical Provider, MD  lisinopril (PRINIVIL,ZESTRIL) 20 MG tablet Take 1 tablet (20 mg total) by mouth daily. 11/01/16  Yes Chereese Cilento Cameron DecemberJose Everley Evora, MD  NON FORMULARY Tribedoce Compuesto vitamin: Take 1 tablet by mouth once a day   Yes Historical Provider, MD  predniSONE (DELTASONE) 20 MG tablet Take 3 tablets (60 mg total) by mouth daily. 1 by mouth twice a day 5 days. Then one by mouth daily 5 days. Patient not taking: Reported on 10/13/2016 09/21/16   Tomasita CrumbleAdeleke Oni, MD    Allergies  Allergen Reactions  . Aspirin Other (See Comments)    Dizzy   . Other Other (See Comments)    Neurobion Forte: Skin "fell off"    Patient Active Problem List   Diagnosis Date Noted  . Hypertension 01/29/2016  . Hypertensive urgency 01/29/2016    Past Medical History:  Diagnosis Date  .  Hypertension     History reviewed. No pertinent surgical history.  Social History   Social History  . Marital status: Married    Spouse name: Lauris Poagmelia  . Number of children: 3  . Years of education: 12   Occupational History  .      [employer]   Social History Main Topics  . Smoking status: Former Smoker    Quit date: 09/03/2001  . Smokeless tobacco: Never Used  . Alcohol use No     Comment: 3 Bud Light beers daily, last used 09/13/16  . Drug use: No  . Sexual activity: Not on file   Other Topics Concern  . 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 No pertinent family history.   Review of Systems  Constitutional: Negative for chills, fever, malaise/fatigue and weight loss.  HENT: Negative for hearing loss, nosebleeds, sinus pain and sore throat.   Eyes: Negative for blurred vision, double vision, discharge and redness.  Respiratory: Negative for cough, hemoptysis and shortness of breath.   Cardiovascular: Negative for chest pain, palpitations, claudication and leg swelling.  Gastrointestinal: Negative for abdominal pain, constipation, diarrhea, nausea and vomiting.  Genitourinary: Negative for dysuria and hematuria.  Musculoskeletal: Negative for myalgias and  neck pain.  Skin: Negative for rash.  Neurological: Negative for dizziness, sensory change, speech change, focal weakness, seizures and headaches.  Endo/Heme/Allergies: Does not bruise/bleed easily.  Psychiatric/Behavioral: The patient is nervous/anxious.   All other systems reviewed and are negative.    Physical Exam   ASSESSMENT & PLAN: [name] was seen today for annual exam.  Diagnoses and all orders for this visit:  Essential hypertension -     Lipid panel -     Hemoglobin A1c -     CBC with Differential/Platelet -     Comprehensive metabolic panel  GAD (generalized  anxiety disorder)  Other orders -     lisinopril (PRINIVIL,ZESTRIL) 20 MG tablet; Take 1 tablet (20 mg total) by mouth daily. -     carvedilol (COREG) 6.25 MG tablet; Take 1 tablet (6.25 mg total) by mouth 2 (two) times daily with a meal.   Continue Zoloft 25mg  daily.    Edwina Barth, MD [facility]

## 2016-11-01 NOTE — Telephone Encounter (Signed)
Pt would like a call back on his lab work and resutls when they come back   Please advise: [phone]

## 2016-11-01 NOTE — Patient Instructions (Addendum)
IF you received an x-ray today, you will receive an invoice from [facility] Radiology. Please contact [facility] Radiology at [phone] with questions or concerns regarding your invoice.   IF you received labwork today, you will receive an invoice from [facility]. Please contact [facility] at [phone] with questions or concerns regarding your invoice.   Our billing staff will not be able to assist you with questions regarding bills from these companies.  You will be contacted with the lab results as soon as they are available. The fastest way to get your results is to activate your My Chart account. Instructions are located on the last page of this paperwork. If you have not heard from [place] regarding the results in 2 weeks, please contact this office.      Hipertensin Hypertension El trmino hipertensin es otra forma de denominar a la presin arterial elevada. La presin arterial elevada fuerza al corazn a trabajar ms para bombear la sangre. Esto puede causar problemas con el paso del [place]. Una lectura de presin arterial est compuesta por 2 nmeros. Hay un nmero superior (sistlico) sobre un nmero inferior (diastlico). Lo ideal es tener la presin arterial por debajo de 120/80. Las decisiones saludables pueden ayudarle a disminuir su presin arterial. Es posible que necesite medicamentos que le ayuden a disminuir su presin arterial si:  Su presin arterial no disminuye mediante decisiones saludables.  Su presin arterial est por encima de 130/80. Siga estas instrucciones en su casa: Comida y bebida   Si se lo indican, siga el plan de alimentacin de DASH (Dietary Approaches to Stop Hypertension, Maneras de alimentarse para detener la hipertensin). Esta dieta incluye:  Que la mitad del plato de cada comida sea de frutas y verduras.  Que un cuarto del plato de cada comida sea de cereales integrales. Los cereales integrales incluyen pasta integral, arroz integral y pan  integral.  Comer y beber productos lcteos con bajo contenido de [place], como leche descremada o yogur bajo en grasas.  Que un cuarto del plato de cada comida sea de protenas bajas en grasa [place]). Las protenas bajas en grasa incluyen pescado, pollo sin piel, huevos, frijoles y tofu.  Evitar consumir carne grasa, carne curada y procesada, o pollo con piel.  Evitar consumir alimentos prehechos o procesados.  Consuma menos de 1500 mg de sal (sodio) por da.  Limite el consumo de alcohol a no ms de 1 medida por da si es mujer y no est Orthoptist y a 2 medidas por da si es hombre. Una medida equivale a 12onzas de cerveza, 5onzas de vino o 1onzas de bebidas alcohlicas de alta graduacin. Estilo de vida   Trabaje con su mdico para mantenerse en un peso saludable o para perder peso. Pregntele a su mdico cul es el peso recomendable para usted.  Realice al menos 30 minutos de ejercicio que haga que se acelere su corazn (ejercicio Magazine features editor) la [employer] de la [place]. Estos pueden incluir caminar, nadar o andar en bicicleta.  Realice al menos 30 minutos de ejercicio que fortalezca sus msculos (ejercicios de resistencia) al menos 3 das a la [place]. Estos pueden incluir levantar pesas o hacer pilates.  No consuma ningn producto que contenga nicotina o tabaco. Esto incluye cigarrillos y cigarrillos electrnicos. Si necesita ayuda para dejar de fumar, consulte al mdico.  Controle su presin arterial en su casa tal como le indic el mdico.  Concurra a todas las visitas de control como se lo haya indicado el mdico.  es importante. Medicamentos   Tome los medicamentos de venta libre y los recetados solamente como se lo haya indicado el mdico. Siga cuidadosamente las indicaciones.  No omita las dosis de medicamentos para la presin arterial. Los medicamentos pierden eficacia si omite dosis. El hecho de omitir las dosis tambin aumenta el riesgo de otros  problemas.  Pregntele a su mdico a qu efectos secundarios o reacciones a los medicamentos debe prestar atencin. Comunquese con un mdico si:  Piensa que tiene una reaccin a los medicamentos que est tomando.  Tiene dolores de cabeza frecuentes (recurrentes).  Siente mareos.  Tiene hinchazn en los tobillos.  Tiene problemas de visin. Solicite ayuda de inmediato si:  Siente un dolor de cabeza muy intenso.  Comienza a sentirse confundido.  Se siente dbil o adormecido.  Siente que va a desmayarse.  Siente un dolor muy intenso en:  El pecho.  El vientre (abdomen).  Devuelve (vomita) ms de una vez.  Tiene dificultad para respirar. Resumen  El trmino hipertensin es otra forma de denominar a la presin arterial elevada.  Las decisiones saludables pueden ayudarle a disminuir su presin arterial. Si no puede controlar su presin arterial mediante decisiones saludables, es posible que deba tomar medicamentos. Esta informacin no tiene como fin reemplazar el consejo del mdico. Asegrese de hacerle al mdico cualquier pregunta que tenga. Document Released: 02/03/2010 Document Revised: 07/28/2016 Document Reviewed: 07/28/2016 Elsevier Interactive Patient Education  2017 Elsevier Inc.  

## 2016-11-02 LAB — CBC WITH DIFFERENTIAL/PLATELET
BASOS ABS: 0 10*3/uL (ref 0.0–0.2)
BASOS: 0 %
EOS (ABSOLUTE): 0.2 10*3/uL (ref 0.0–0.4)
Eos: 3 %
HEMOGLOBIN: 15.1 g/dL (ref 13.0–17.7)
Hematocrit: 44.6 % (ref 37.5–51.0)
IMMATURE GRANS (ABS): 0 10*3/uL (ref 0.0–0.1)
Immature Granulocytes: 0 %
LYMPHS: 37 %
Lymphocytes Absolute: 3.2 10*3/uL — ABNORMAL HIGH (ref 0.7–3.1)
MCH: 30.7 pg (ref 26.6–33.0)
MCHC: 33.9 g/dL (ref 31.5–35.7)
MCV: 91 fL (ref 79–97)
MONOCYTES: 8 %
Monocytes Absolute: 0.7 10*3/uL (ref 0.1–0.9)
NEUTROS ABS: 4.4 10*3/uL (ref 1.4–7.0)
Neutrophils: 52 %
Platelets: 268 10*3/uL (ref 150–379)
RBC: 4.92 x10E6/uL (ref 4.14–5.80)
RDW: 13.6 % (ref 12.3–15.4)
WBC: 8.5 10*3/uL (ref 3.4–10.8)

## 2016-11-02 LAB — LIPID PANEL
CHOLESTEROL TOTAL: 193 mg/dL (ref 100–199)
Chol/HDL Ratio: 4.4 ratio units (ref 0.0–5.0)
HDL: 44 mg/dL (ref >39)
LDL Calculated: 108 mg/dL — ABNORMAL HIGH (ref 0–99)
TRIGLYCERIDES: 207 mg/dL — AB (ref 0–149)
VLDL CHOLESTEROL CAL: 41 mg/dL — AB (ref 5–40)

## 2016-11-02 LAB — COMPREHENSIVE METABOLIC PANEL
ALT: 41 IU/L (ref 0–44)
AST: 31 IU/L (ref 0–40)
Albumin/Globulin Ratio: 1.7 (ref 1.2–2.2)
Albumin: 4.5 g/dL (ref 3.5–5.5)
Alkaline Phosphatase: 105 IU/L (ref 39–117)
BILIRUBIN TOTAL: 0.5 mg/dL (ref 0.0–1.2)
BUN / CREAT RATIO: 20 (ref 9–20)
BUN: 13 mg/dL (ref 6–24)
CHLORIDE: 103 mmol/L (ref 96–106)
CO2: 26 mmol/L (ref 18–29)
CREATININE: 0.66 mg/dL — AB (ref 0.76–1.27)
Calcium: 9.2 mg/dL (ref 8.7–10.2)
GFR, EST AFRICAN AMERICAN: 136 mL/min/{1.73_m2} (ref >59)
GFR, EST NON AFRICAN AMERICAN: 118 mL/min/{1.73_m2} (ref >59)
GLUCOSE: 105 mg/dL — AB (ref 65–99)
Globulin, Total: 2.6 g/dL (ref 1.5–4.5)
Potassium: 4.4 mmol/L (ref 3.5–5.2)
Sodium: 144 mmol/L (ref 134–144)
TOTAL PROTEIN: 7.1 g/dL (ref 6.0–8.5)

## 2016-11-02 LAB — HEMOGLOBIN A1C
ESTIMATED AVERAGE GLUCOSE: 131 mg/dL
Hgb A1c MFr Bld: 6.2 % — ABNORMAL HIGH (ref 4.8–5.6)

## 2016-11-02 NOTE — Telephone Encounter (Signed)
Pt was only seen yesterday but requests a call back with lab results once they've been reviewed.  Cameron PickKimberly S. Tiburcio PeaHarris, MSN, FNP-C [facility] [phone]

## 2016-11-03 ENCOUNTER — Encounter: Payer: Self-pay | Admitting: Emergency Medicine

## 2016-11-09 ENCOUNTER — Emergency Department (HOSPITAL_COMMUNITY)
Admission: EM | Admit: 2016-11-09 | Discharge: 2016-11-10 | Disposition: A | Discharge status: Home / Self Care | Payer: 59 | Attending: Emergency Medicine | Admitting: Emergency Medicine

## 2016-11-09 ENCOUNTER — Encounter (HOSPITAL_COMMUNITY): Payer: Self-pay | Admitting: Emergency Medicine

## 2016-11-09 DIAGNOSIS — F419 Anxiety disorder, unspecified: Secondary | ICD-10-CM | POA: Diagnosis not present

## 2016-11-09 DIAGNOSIS — I10 Essential (primary) hypertension: Secondary | ICD-10-CM | POA: Diagnosis not present

## 2016-11-09 DIAGNOSIS — R42 Dizziness and giddiness: Secondary | ICD-10-CM | POA: Diagnosis present

## 2016-11-09 DIAGNOSIS — Z87891 Personal history of nicotine dependence: Secondary | ICD-10-CM | POA: Insufficient documentation

## 2016-11-09 DIAGNOSIS — Z79899 Other long term (current) drug therapy: Secondary | ICD-10-CM | POA: Diagnosis not present

## 2016-11-09 NOTE — ED Triage Notes (Signed)
Pt states that he has been seeing a psychiatrist for anxiety and he has felt confused since he started taking a new medication from them. Pt still feels anxious and is hypertensive. Alert and oriented.

## 2016-11-09 NOTE — ED Provider Notes (Signed)
[facility]-EMERGENCY DEPT Provider Note   CSN: [id number] Arrival date & time: 11/09/16  1828   By signing my name below, I, Freida Busman, attest that this documentation has been prepared under the direction and in the presence of Arthor Captain, PA-C. Electronically Signed: Freida Busman, Scribe. 11/09/2016. 10:55 PM.  History   Chief Complaint Chief Complaint  Patient presents with  . Anxiety   The history is provided by the patient. A language interpreter was used ([place] ).    HPI Comments:  Cameron Haney is a 46 y.o. male who presents to the Emergency Department complaining of intermittent lightheadedness x 2 days. He states the dizziness began after he started to feel angry while at work. With episode he also experienced a slight HA and confusion. Two weeks ago he saw a psychiatrist for his "nerves" and was prescribed gabapentin 100mg  to be taken up to 3 times daily and sertraline 25mg . He notes he did not take his meds for a few days and then took a double dose of the sertraline which led to him feeling more anxious. He has also felt angry and been having trouble sleeping since he started taking these meds. He has told his psychiatrist that when he was in [place] he was prescribed clonazepam for his anxiety but his meds were not changed. He has not taken the clonazepam in ~10 months. Pt has a h/o HTN;  also notes elevated BP today.  Past Medical History:  Diagnosis Date  . Hypertension     Patient Active Problem List   Diagnosis Date Noted  . GAD (generalized anxiety disorder) 11/01/2016  . Hypertension 01/29/2016  . Hypertensive urgency 01/29/2016    History reviewed. No pertinent surgical history.     Home Medications    Prior to Admission medications   Medication Sig Start Date End Date Taking? Authorizing Provider  carvedilol (COREG) 6.25 MG tablet Take 1 tablet (6.25 mg total) by mouth 2 (two) times daily with a meal. 11/01/16  Yes [facility], MD    gabapentin (NEURONTIN) 100 MG capsule Take 100 mg by mouth 3 (three) times daily as needed (anxiety).   Yes Historical Provider, MD  ibuprofen (ADVIL,MOTRIN) 200 MG tablet Take 200-800 mg by mouth every 6 (six) hours as needed for moderate pain.   Yes Historical Provider, MD  lisinopril (PRINIVIL,ZESTRIL) 20 MG tablet Take 1 tablet (20 mg total) by mouth daily. 11/01/16  Yes Miguel Victorino December, MD  sertraline (ZOLOFT) 25 MG tablet Take 50 mg by mouth daily.   Yes Historical Provider, MD  clonazePAM (KLONOPIN) 1 MG tablet Take 1 tablet (1 mg total) by mouth at bedtime as needed for anxiety. Patient not taking: Reported on 11/09/2016 10/13/16   Margarita Grizzle, MD  predniSONE (DELTASONE) 20 MG tablet Take 3 tablets (60 mg total) by mouth daily. 1 by mouth twice a day 5 days. Then one by mouth daily 5 days. Patient not taking: Reported on 10/13/2016 09/21/16   Tomasita Crumble, MD    Family History History reviewed. No pertinent family history.  Social History Social History  Substance Use Topics  . Smoking status: Former Smoker    Quit date: 09/03/2001  . Smokeless tobacco: Never Used  . Alcohol use No     Comment: 3 Bud Light beers daily, last used 09/13/16     Allergies   Aspirin and Other   Review of Systems Review of Systems  Constitutional: Negative for chills and fever.  Respiratory: Negative for shortness  of breath.   Cardiovascular: Negative for chest pain.  Neurological: Positive for light-headedness.  Psychiatric/Behavioral: Positive for sleep disturbance. The patient is nervous/anxious.   All other systems reviewed and are negative.    Physical Exam Updated Vital Signs BP (!) 167/102 (BP Location: Left Arm)   Pulse 68   Temp 97.5 F (36.4 C) (Oral)   Resp 18   SpO2 100%   Physical Exam  Constitutional: He is oriented to person, place, and time. He appears well-developed and well-nourished. No distress.  HENT:  Head: Normocephalic and atraumatic.  Eyes: Conjunctivae  are normal.  Cardiovascular: Normal rate, regular rhythm and normal heart sounds.   Pulmonary/Chest: Effort normal and breath sounds normal. No respiratory distress.  Abdominal: He exhibits no distension.  Neurological: He is alert and oriented to person, place, and time. No cranial nerve deficit or sensory deficit. Coordination normal.  Skin: Skin is warm and dry.  Psychiatric: He has a normal mood and affect.  Nursing note and vitals reviewed.    ED Treatments / Results  DIAGNOSTIC STUDIES:  Oxygen Saturation is 100% on RA, normal by my interpretation.    COORDINATION OF CARE:  10:55 PM Discussed treatment plan with pt at bedside and pt agreed to plan.  Labs (all labs ordered are listed, but only abnormal results are displayed) Labs Reviewed  CBC - Abnormal; Notable for the following:       Result Value   WBC 11.0 (*)    All other components within normal limits  BASIC METABOLIC PANEL - Abnormal; Notable for the following:    Potassium 3.4 (*)    Glucose, Bld 100 (*)    All other components within normal limits  I-STAT CHEM 8, ED - Abnormal; Notable for the following:    Potassium 3.4 (*)    Glucose, Bld 101 (*)    Calcium, Ion 1.08 (*)    All other components within normal limits    EKG  EKG Interpretation  Date/Time:  Wednesday November 10 2016 00:18:45 EDT Ventricular Rate:  69 PR Interval:    QRS Duration: 87 QT Interval:  432 QTC Calculation: 463 R Axis:   24 Text Interpretation:  Sinus rhythm Right atrial enlargement Probable LVH with secondary repol abnrm Abnormal T, probable ischemia, lateral leads Anterior ST elevation, probably due to LVH No significant change was found Confirmed by Read Drivers  MD, Jonny Ruiz ([id number]) on 11/10/2016 12:55:34 AM       Radiology No results found.  Procedures Procedures (including critical care time)  Medications Ordered in ED Medications  hydrOXYzine (ATARAX/VISTARIL) tablet 25 mg (25 mg Oral Given 11/10/16 0049)     Initial  Impression / Assessment and Plan / ED Course  I have reviewed the triage vital signs and the nursing notes.  Pertinent labs & imaging results that were available during my care of the patient were reviewed by me and considered in my medical decision making (see chart for details).     Patient with hypertension. Normal neurologic examination. I believe his symptoms do not represent TIA, but rather a transient alteration of awareness secondary to his use of sertraline. I believe the patient should discontinue this medication. I will give him hydroxyzine for his anxiety. I had a long discussion with the patient about what I felt was appropriate  for prescription from the ER and I have declined to prescribe Klonopin at this time. The patient does not appear to have any significant lab abnormalities. His EKG is abnormal, but is  unchanged from previous. Patient is advised to follow-up with his psychiatrist in [place].  Final Clinical Impressions(s) / ED Diagnoses   Final diagnoses:  Anxiety  Hypertension, unspecified type    Medical screening examination/treatment/procedure(s) were performed by non-physician practitioner and as supervising physician I was immediately available for consultation/collaboration.   EKG Interpretation  Date/Time:  Wednesday November 10 2016 00:18:45 EDT Ventricular Rate:  69 PR Interval:    QRS Duration: 87 QT Interval:  432 QTC Calculation: 463 R Axis:   24 Text Interpretation:  Sinus rhythm Right atrial enlargement Probable LVH with secondary repol abnrm Abnormal T, probable ischemia, lateral leads Anterior ST elevation, probably due to LVH No significant change was found Confirmed by Read DriversMOLPUS  MD, Jonny RuizJOHN ([id number]) on 11/10/2016 12:55:34 AM Also confirmed by Read DriversMOLPUS  MD, Jonny RuizJOHN ([id number]), editor [address], Jola BabinskiMarilyn [phone]  on 11/10/2016 8:06:13 AM        .    Shaune Pollackameron Endi Lagman, MD 11/14/16 [phone]

## 2016-11-10 LAB — CBC
HEMATOCRIT: 41.1 % (ref 39.0–52.0)
HEMOGLOBIN: 14.8 g/dL (ref 13.0–17.0)
MCH: 31.2 pg (ref 26.0–34.0)
MCHC: 36 g/dL (ref 30.0–36.0)
MCV: 86.7 fL (ref 78.0–100.0)
Platelets: 206 10*3/uL (ref 150–400)
RBC: 4.74 MIL/uL (ref 4.22–5.81)
RDW: 12.8 % (ref 11.5–15.5)
WBC: 11 10*3/uL — AB (ref 4.0–10.5)

## 2016-11-10 LAB — BASIC METABOLIC PANEL
ANION GAP: 7 (ref 5–15)
BUN: 13 mg/dL (ref 6–20)
CHLORIDE: 106 mmol/L (ref 101–111)
CO2: 24 mmol/L (ref 22–32)
Calcium: 8.9 mg/dL (ref 8.9–10.3)
Creatinine, Ser: 0.69 mg/dL (ref 0.61–1.24)
GFR calc non Af Amer: >60 mL/min (ref >60)
Glucose, Bld: 100 mg/dL — ABNORMAL HIGH (ref 65–99)
Potassium: 3.4 mmol/L — ABNORMAL LOW (ref 3.5–5.1)
SODIUM: 137 mmol/L (ref 135–145)

## 2016-11-10 LAB — I-STAT CHEM 8, ED
BUN: 11 mg/dL (ref 6–20)
Calcium, Ion: 1.08 mmol/L — ABNORMAL LOW (ref 1.15–1.40)
Chloride: 104 mmol/L (ref 101–111)
Creatinine, Ser: 0.7 mg/dL (ref 0.61–1.24)
Glucose, Bld: 101 mg/dL — ABNORMAL HIGH (ref 65–99)
HEMATOCRIT: 43 % (ref 39.0–52.0)
HEMOGLOBIN: 14.6 g/dL (ref 13.0–17.0)
POTASSIUM: 3.4 mmol/L — AB (ref 3.5–5.1)
SODIUM: 139 mmol/L (ref 135–145)
TCO2: 25 mmol/L (ref 0–100)

## 2016-11-10 MED ORDER — HYDROXYZINE HCL 25 MG PO TABS
25.0000 mg | ORAL_TABLET | Freq: Once | ORAL | Status: AC
  Administered 2016-11-10: 25 mg via ORAL
  Filled 2016-11-10: qty 1

## 2016-11-10 MED ORDER — HYDROXYZINE HCL 25 MG PO TABS: 25.0000 mg | ORAL_TABLET | Freq: Four times a day (QID) | ORAL | 0 refills | Status: DC

## 2016-11-10 NOTE — Discharge Instructions (Signed)
Please discontinue your sertraline. He may take the medication I prescribed for anxiety as needed. Please follow up with her doctor concerning her high blood pressure. Return if you develop any visual changes, difficulty with speech, facial paralysis, shortness of breath, chest pain, severe headaches.

## 2016-11-10 NOTE — ED Notes (Signed)
EKG given to Dr.Molpus 

## 2016-11-24 ENCOUNTER — Ambulatory Visit (INDEPENDENT_AMBULATORY_CARE_PROVIDER_SITE_OTHER): Payer: 59 | Admitting: Emergency Medicine

## 2016-11-24 VITALS — BP 142/93 | HR 76 | Temp 98.5°F | Resp 14 | Ht 65.0 in | Wt 215.0 lb

## 2016-11-24 DIAGNOSIS — I10 Essential (primary) hypertension: Secondary | ICD-10-CM | POA: Diagnosis not present

## 2016-11-24 DIAGNOSIS — E785 Hyperlipidemia, unspecified: Secondary | ICD-10-CM | POA: Diagnosis not present

## 2016-11-24 DIAGNOSIS — F411 Generalized anxiety disorder: Secondary | ICD-10-CM

## 2016-11-24 DIAGNOSIS — R7303 Prediabetes: Secondary | ICD-10-CM

## 2016-11-24 MED ORDER — LISINOPRIL 20 MG PO TABS: 20.0000 mg | ORAL_TABLET | Freq: Every day | ORAL | 5 refills | Status: DC

## 2016-11-24 MED ORDER — CARVEDILOL 6.25 MG PO TABS: 6.2500 mg | ORAL_TABLET | Freq: Two times a day (BID) | ORAL | 5 refills | Status: DC

## 2016-11-24 MED ORDER — PRAVASTATIN SODIUM 20 MG PO TABS: 20.0000 mg | ORAL_TABLET | Freq: Every day | ORAL | 3 refills | Status: DC

## 2016-11-24 MED ORDER — METFORMIN HCL 500 MG PO TABS: 500.0000 mg | ORAL_TABLET | Freq: Two times a day (BID) | ORAL | 3 refills | Status: DC

## 2016-11-24 NOTE — Progress Notes (Signed)
Cameron Haney 46 y.o.   Chief Complaint  Patient presents with  . Discuss lab results    HISTORY OF PRESENT ILLNESS: This is a 46 y.o. male concerned about recent blood results. Doing well and has no complaints; recently saw Psychiatrist that prescribed Risperdal and Klonazepam but not taking them because he feels fine. Blood results show pre-diabetes and slightly elevated TG and LDL. HPI   Prior to Admission medications   Medication Sig Start Date End Date Taking? Authorizing Provider  carvedilol (COREG) 6.25 MG tablet Take 1 tablet (6.25 mg total) by mouth 2 (two) times daily with a meal. 11/24/16  Yes Georgina Quint, MD  clonazePAM (KLONOPIN) 1 MG tablet Take 1 tablet (1 mg total) by mouth at bedtime as needed for anxiety. 10/13/16  Yes Margarita Grizzle, MD  gabapentin (NEURONTIN) 100 MG capsule Take 100 mg by mouth 3 (three) times daily as needed (anxiety).   Yes Historical Provider, MD  hydrOXYzine (ATARAX/VISTARIL) 25 MG tablet Take 1 tablet (25 mg total) by mouth every 6 (six) hours. 11/10/16  Yes Arthor Captain, PA-C  lisinopril (PRINIVIL,ZESTRIL) 20 MG tablet Take 1 tablet (20 mg total) by mouth daily. 11/24/16  Yes [facility], MD  ibuprofen (ADVIL,MOTRIN) 200 MG tablet Take 200-800 mg by mouth every 6 (six) hours as needed for moderate pain.    Historical Provider, MD  metFORMIN (GLUCOPHAGE) 500 MG tablet Take 1 tablet (500 mg total) by mouth 2 (two) times daily with a meal. 11/24/16   Georgina Quint, MD  pravastatin (PRAVACHOL) 20 MG tablet Take 1 tablet (20 mg total) by mouth daily. 11/24/16   Georgina Quint, MD    Allergies  Allergen Reactions  . Aspirin Other (See Comments)    Dizzy   . Other Other (See Comments)    Neurobion Forte: Skin "fell off"    Patient Active Problem List   Diagnosis Date Noted  . Pre-diabetes 11/24/2016  . Dyslipidemia 11/24/2016  . GAD (generalized anxiety disorder) 11/01/2016  . Hypertension 01/29/2016  .  Hypertensive urgency 01/29/2016    Past Medical History:  Diagnosis Date  . Hypertension     No past surgical history on file.  Social History   Social History  . Marital status: Married    Spouse name: Lauris Poag  . Number of children: 3  . Years of education: 12   Occupational History  .      [employer]   Social History Main Topics  . Smoking status: Former Smoker    Quit date: 09/03/2001  . Smokeless tobacco: Never Used  . Alcohol use No     Comment: 3 Bud Light beers daily, last used 09/13/16  . Drug use: No  . Sexual activity: Not on file   Other Topics Concern  . 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   Review of Systems  Constitutional: Negative for chills, fever, malaise/fatigue and weight loss.  HENT: Negative.  Negative for congestion, nosebleeds and sinus pain.   Eyes: Negative.  Negative for blurred vision and double vision.  Respiratory: Negative.  Negative for cough, shortness of breath and wheezing.   Cardiovascular: Negative.  Negative for chest pain, palpitations and leg swelling.  Gastrointestinal: Negative.  Negative for abdominal pain, diarrhea, nausea and vomiting.  Genitourinary: Negative.  Negative for dysuria and  hematuria.  Musculoskeletal: Negative.  Negative for back pain, myalgias and neck pain.  Skin: Negative for rash.  Neurological: Negative.  Negative for dizziness and headaches.  Endo/Heme/Allergies: Negative.   Psychiatric/Behavioral: Negative.   All other systems reviewed and are negative.  Vitals:   11/24/16 1015  BP: (!) 142/93  Pulse: 76  Resp: 14  Temp: 98.5 F (36.9 C)     Physical Exam  Constitutional: He is oriented to person, place, and time. He appears well-developed and well-nourished.  HENT:  Head: Normocephalic and atraumatic.  Right Ear: External ear normal.  Left Ear:  External ear normal.  Nose: Nose normal.  Mouth/Throat: Oropharynx is clear and moist. No oropharyngeal exudate.  Eyes: Conjunctivae and EOM are normal. Pupils are equal, round, and reactive to light.  Neck: Normal range of motion. Neck supple. No JVD present. No thyromegaly present.  Cardiovascular: Normal rate, regular rhythm, normal heart sounds and intact distal pulses.   Pulmonary/Chest: Effort normal and breath sounds normal. He has no rales.  Abdominal: Soft. Bowel sounds are normal. He exhibits no distension. There is no tenderness.  Musculoskeletal: Normal range of motion.  Lymphadenopathy:    He has no cervical adenopathy.  Neurological: He is alert and oriented to person, place, and time. No sensory deficit. He exhibits normal muscle tone. Coordination normal.  Skin: Skin is warm and dry. Capillary refill takes less than 2 seconds.  Security left ankle bracelet present.  Psychiatric: He has a normal mood and affect. His behavior is normal.  Vitals reviewed.    ASSESSMENT & PLAN: Cameron Haney was seen today for discuss lab results.  Diagnoses and all orders for this visit:  Pre-diabetes  Essential hypertension  GAD (generalized anxiety disorder)  Dyslipidemia  Other orders -     metFORMIN (GLUCOPHAGE) 500 MG tablet; Take 1 tablet (500 mg total) by mouth 2 (two) times daily with a meal. -     pravastatin (PRAVACHOL) 20 MG tablet; Take 1 tablet (20 mg total) by mouth daily. -     Discontinue: lisinopril (PRINIVIL,ZESTRIL) 20 MG tablet; Take 1 tablet (20 mg total) by mouth daily. -     Discontinue: carvedilol (COREG) 6.25 MG tablet; Take 1 tablet (6.25 mg total) by mouth 2 (two) times daily with a meal. -     lisinopril (PRINIVIL,ZESTRIL) 20 MG tablet; Take 1 tablet (20 mg total) by mouth daily. -     carvedilol (COREG) 6.25 MG tablet; Take 1 tablet (6.25 mg total) by mouth 2 (two) times daily with a meal.   Patient Instructions       IF you received an x-ray today,  you will receive an invoice from [facility]. Please contact [facility] at [phone] with questions or concerns regarding your invoice.   IF you received labwork today, you will receive an invoice from [facility]. Please contact [facility] at [phone] with questions or concerns regarding your invoice.   Our billing staff will not be able to assist you with questions regarding bills from these companies.  You will be contacted with the lab results as soon as they are available. The fastest way to get your results is to activate your My Chart account. Instructions are located on the last page of this paperwork. If you have not heard from [place] regarding the results in 2 weeks, please contact this office.    Plan de alimentacin para la prediabetes (Prediabetes Eating Plan) La prediabetes, tambin llamada intolerancia a la glucosa o alteracin de la glucosa en  ayunas, es una afeccin que eleva los niveles de azcar en la sangre (glucemia) por encima de lo normal. Seguir una dieta saludable puede ayudar a mantener la prediabetes bajo control, y tambin reduce el riesgo de tener diabetes tipo2 y cardiopata, que es ms alto en las personas que tienen esta afeccin. Junto con la actividad fsica habitual, una dieta saludable:  Promueve la prdida de De Borgiapeso.  Ayuda a Medical sales representativecontrolar los niveles de Bankerazcar en la sangre.  Ayuda a mejorar la forma en que el organismo Botswanausa la insulina. QU DEBO SABER ACERCA DE ESTE PLAN DE ALIMENTACIN?  Use el ndice glucmico (IG) para planificar las comidas. El ndice le informa con qu rapidez un alimento elevar su nivel de azcar en la sangre. Elija los alimentos con bajo IG. Estos tardan ms tiempo en subir el nivel de azcar en la sangre.  Preste mucha atencin a la cantidad de hidratos de carbono que hay en los alimentos que consume. Los hidratos de carbono OfficeMax Incorporatedaumentan los niveles de Bankerazcar en la sangre.  Lleve un registro de la cantidad de caloras que  ingiere. Ingerir la cantidad correcta de caloras lo ayudar a Cabin crewalcanzar un peso saludable. Bajar alrededor del 7por ciento del peso inicial puede ayudar a Automotive engineerevitar la diabetes tipo2.  Tal vez deba seguir Clinical cytogeneticistuna dieta mediterrnea. Esta incluye una gran cantidad de verduras, carnes magras o pescado, cereales integrales, frutas, as como aceites y grasas saludables. QU ALIMENTOS PUEDO COMER? Cereales Cereales integrales, como panes, galletas, cereales y pastas de salvado o integrales. Avena sin azcar. Trigo burgol. Cebada. Quinua. Arroz integral. Tortillas o tacos de harina de maz o de salvado. Hoover BrunetteVerduras Deatra JamesLechuga. Espinaca. Guisantes. Remolachas. Coliflor. Repollo. Brcoli. Zanahorias. Tomates. Calabaza. Christella NoaBerenjena. Hierbas. Pimientos. Cebollas. Pepinos. Repollitos de Bruselas. Frutas Frutos rojos. Bananas. Manzanas. Naranjas. Uvas. Papaya. Mango. [place]. Kiwi. Pomelo. Cerezas. Carnes y otras fuentes de protenas Mariscos. Carnes [place] Highlands Southmagras, entre ellas, pollo y Gordopavo o cortes magros de carne de cerdo y de [place] Southvaca. Tofu. Huevos. Los frutos secos. Frijoles. Lcteos Productos lcteos descremados o semidescremados, como yogur, queso cottage y Grundy Centerqueso. CHS IncBebidas Agua. T. Caf. Gaseosas sin azcar o dietticas. Agua de EdinburghSeltz. Leche. Productos alternativos de la Eddyvilleleche, [address]como leche de soja o de Green Springalmendra. Condimentos Mostaza. Salsa de pepinillos. [place] con bajo contenido de Antar[place] (the [place] of 60 deg S[place]grasa y de International aid/development workerazcar. Salsa barbacoa con bajo contenido de grasa y de azcar. Mayonesa sin grasa o con bajo contenido de North Newtongrasa. Dulces y postres Budines sin azcar o con bajo contenido de Mardela Springsgrasa. Helados y otros dulces congelados sin azcar o con bajo contenido de North Omakgrasa. Grasas y Arts development officeraceites Aguacate. Nueces. Aceite de oliva. Los artculos mencionados arriba pueden no ser Raytheonuna lista completa de las bebidas o los alimentos recomendados. Comunquese con el nutricionista para conocer ms opciones. QU ALIMENTOS NO SE RECOMIENDAN? Cereales Productos a  base de [place] y de Madagascarharina blanca refinada, como panes, pastas, bocadillos y cereales. Bebidas Bebidas azucaradas, como t helado y gaseosas con International aid/development workerazcar. Dulces y [address] Productos de Hastingspanadera, Lionvillecomo tortas, Avistonmadalenas, Sanfordpasteles, Programmer, systemsgalletitas y tarta de Ashlandqueso. Los artculos mencionados arriba pueden no ser Raytheonuna lista completa de las bebidas y los alimentos que se Theatre stage managerdeben evitar. Comunquese con el nutricionista para obtener ms informacin. Esta informacin no tiene Theme park managercomo fin reemplazar el consejo del mdico. Asegrese de hacerle al mdico cualquier pregunta que tenga. Document Released: 05/07/2015 Document Revised: 05/07/2015 Document Reviewed: 09/11/2014 Elsevier Interactive Patient Education  2017 Elsevier Inc.      Edwina BarthMiguel Cashis Rill, MD [facility]

## 2016-11-24 NOTE — Patient Instructions (Addendum)
IF you received an x-ray today, you will receive an invoice from [facility]. Please contact [facility] at [phone] with questions or concerns regarding your invoice.   IF you received labwork today, you will receive an invoice from [facility]. Please contact [facility] at [phone] with questions or concerns regarding your invoice.   Our billing staff will not be able to assist you with questions regarding bills from these companies.  You will be contacted with the lab results as soon as they are available. The fastest way to get your results is to activate your My Chart account. Instructions are located on the last page of this paperwork. If you have not heard from [place] regarding the results in 2 weeks, please contact this office.    Plan de alimentacin para la prediabetes (Prediabetes Eating Plan) La prediabetes, tambin llamada intolerancia a la glucosa o alteracin de la glucosa en ayunas, es una afeccin que eleva los niveles de azcar en la sangre (glucemia) por encima de lo normal. Seguir una dieta saludable puede ayudar a mantener la prediabetes bajo control, y tambin reduce el riesgo de tener diabetes tipo2 y cardiopata, que es ms alto en las personas que tienen esta afeccin. Junto con la actividad fsica habitual, una dieta saludable:  Promueve la prdida de [place].  Ayuda a Medical sales representative de Banker.  Ayuda a mejorar la forma en que el organismo [place] la insulina. QU DEBO SABER ACERCA DE ESTE PLAN DE ALIMENTACIN?  Use el ndice glucmico (IG) para planificar las comidas. El ndice le informa con qu rapidez un alimento elevar su nivel de azcar en la sangre. Elija los alimentos con bajo IG. Estos tardan ms tiempo en subir el nivel de azcar en la sangre.  Preste mucha atencin a la cantidad de hidratos de carbono que hay en los alimentos que consume. Los hidratos de carbono [employer] niveles de Banker.  Lleve un  registro de la cantidad de caloras que ingiere. Ingerir la cantidad correcta de caloras lo ayudar a Cabin crew peso saludable. Bajar alrededor del 7por ciento del peso inicial puede ayudar a Automotive engineer la diabetes tipo2.  Tal vez deba seguir Clinical cytogeneticist. Esta incluye una gran cantidad de verduras, carnes magras o pescado, cereales integrales, frutas, as como aceites y grasas saludables. QU ALIMENTOS PUEDO COMER? Cereales Cereales integrales, como panes, galletas, cereales y pastas de salvado o integrales. Avena sin azcar. Trigo burgol. Cebada. Quinua. Arroz integral. Tortillas o tacos de harina de maz o de salvado. Hoover Brunette Deatra James. Espinaca. Guisantes. Remolachas. Coliflor. Repollo. Brcoli. Zanahorias. Tomates. Cal[place]aza. Christella Noa. Hierbas. Pimientos. Cebollas. Pepinos. Repollitos de Bruselas. Frutas Frutos rojos. Bananas. Manzanas. Naranjas. Uvas. Papaya. Mango. [place]. Kiwi. Pomelo. Cerezas. Carnes y otras fuentes de protenas Mariscos. Carnes [place], entre ellas, pollo y [place] o cortes magros de carne de cerdo y de [place]. Tofu. Huevos. Los frutos secos. Frijoles. Lcteos Productos lcteos descremados o semidescremados, como yogur, queso cottage y [place]. [employer] T. Caf. Gaseosas sin azcar o dietticas. Agua de [place]. Leche. Productos alternativos de la [place], [address] de soja o de [place]. Condimentos [place]. Salsa de pepinillos. [place] con bajo contenido de [place] y de International aid/development worker. Salsa barbacoa con bajo contenido de grasa y de azcar. Mayonesa sin grasa o con bajo contenido de [place]. Dulces y postres Budines sin azcar o con bajo contenido de [place]. Helados y otros dulces congelados sin azcar o con bajo contenido de [place]. Grasas y Arts development officer. Nueces.  Aceite de oliva. Los artculos mencionados arriba pueden no ser Raytheonuna lista completa de las bebidas o los alimentos recomendados. Comunquese con el nutricionista para conocer ms opciones. QU ALIMENTOS NO  SE RECOMIENDAN? Cereales Productos a base de [place] y de Madagascarharina blanca refinada, como panes, pastas, bocadillos y cereales. Bebidas Bebidas azucaradas, como t helado y gaseosas con International aid/development workerazcar. Dulces y [address] Productos de Elbertapanadera, Dentoncomo tortas, Uticamadalenas, Lexingtonpasteles, Programmer, systemsgalletitas y tarta de Pagelandqueso. Los artculos mencionados arriba pueden no ser Raytheonuna lista completa de las bebidas y los alimentos que se Theatre stage managerdeben evitar. Comunquese con el nutricionista para obtener ms informacin. Esta informacin no tiene Theme park managercomo fin reemplazar el consejo del mdico. Asegrese de hacerle al mdico cualquier pregunta que tenga. Document Released: 05/07/2015 Document Revised: 05/07/2015 Document Reviewed: 09/11/2014 Elsevier Interactive Patient Education  2017 [employer]

## 2016-12-16 ENCOUNTER — Encounter (HOSPITAL_COMMUNITY): Payer: Self-pay | Admitting: *Deleted

## 2016-12-16 DIAGNOSIS — I10 Essential (primary) hypertension: Secondary | ICD-10-CM | POA: Diagnosis present

## 2016-12-16 DIAGNOSIS — Z87891 Personal history of nicotine dependence: Secondary | ICD-10-CM | POA: Diagnosis not present

## 2016-12-16 DIAGNOSIS — Z79899 Other long term (current) drug therapy: Secondary | ICD-10-CM | POA: Diagnosis not present

## 2016-12-16 NOTE — ED Triage Notes (Addendum)
Pt complains of hypertension, left sided headache, confusion x 3 days.   Pt took risperidone last week and feels like he is having a reaction to the medication, making him anxious. Pt states he feels like "running and screaming" and is having nightmares.   Pt came to ED 3 days ago and was given a medication for anxiety 3, which he states made him feel worse the next day.   Pt states he wants something to control his blood pressure. Pt states his PCP is not adjusting his blood medication as he feels necessary and would like to be evaluated and treated in the ED for hypertension.  Pt denies SI/HI  Pt would also like a month supply of clonazepam.  Pt speaks Spanish, video interpreter used.

## 2016-12-17 ENCOUNTER — Encounter (HOSPITAL_COMMUNITY): Payer: Self-pay | Admitting: Emergency Medicine

## 2016-12-17 ENCOUNTER — Emergency Department (HOSPITAL_COMMUNITY)
Admission: EM | Admit: 2016-12-17 | Discharge: 2016-12-17 | Disposition: A | Discharge status: Home / Self Care | Payer: 59 | Attending: Emergency Medicine | Admitting: Emergency Medicine

## 2016-12-17 DIAGNOSIS — I10 Essential (primary) hypertension: Secondary | ICD-10-CM

## 2016-12-17 LAB — COMPREHENSIVE METABOLIC PANEL
ALK PHOS: 75 U/L (ref 38–126)
ALT: 41 U/L (ref 17–63)
AST: 38 U/L (ref 15–41)
Albumin: 4.1 g/dL (ref 3.5–5.0)
Anion gap: 9 (ref 5–15)
BUN: 11 mg/dL (ref 6–20)
CALCIUM: 9 mg/dL (ref 8.9–10.3)
CO2: 23 mmol/L (ref 22–32)
CREATININE: 0.75 mg/dL (ref 0.61–1.24)
Chloride: 107 mmol/L (ref 101–111)
Glucose, Bld: 81 mg/dL (ref 65–99)
Potassium: 3.5 mmol/L (ref 3.5–5.1)
Sodium: 139 mmol/L (ref 135–145)
Total Bilirubin: 1.1 mg/dL (ref 0.3–1.2)
Total Protein: 7.5 g/dL (ref 6.5–8.1)

## 2016-12-17 LAB — CBC WITH DIFFERENTIAL/PLATELET
Basophils Absolute: 0 10*3/uL (ref 0.0–0.1)
Basophils Relative: 0 %
EOS ABS: 0.4 10*3/uL (ref 0.0–0.7)
EOS PCT: 4 %
HCT: 41.5 % (ref 39.0–52.0)
HEMOGLOBIN: 14.6 g/dL (ref 13.0–17.0)
LYMPHS ABS: 4.1 10*3/uL — AB (ref 0.7–4.0)
Lymphocytes Relative: 39 %
MCH: 31.2 pg (ref 26.0–34.0)
MCHC: 35.2 g/dL (ref 30.0–36.0)
MCV: 88.7 fL (ref 78.0–100.0)
MONO ABS: 1.1 10*3/uL — AB (ref 0.1–1.0)
MONOS PCT: 11 %
NEUTROS PCT: 46 %
Neutro Abs: 4.8 10*3/uL (ref 1.7–7.7)
PLATELETS: 218 10*3/uL (ref 150–400)
RBC: 4.68 MIL/uL (ref 4.22–5.81)
RDW: 13.3 % (ref 11.5–15.5)
WBC: 10.5 10*3/uL (ref 4.0–10.5)

## 2016-12-17 LAB — RAPID URINE DRUG SCREEN, HOSP PERFORMED
Amphetamines: NOT DETECTED
Barbiturates: NOT DETECTED
Benzodiazepines: NOT DETECTED
COCAINE: NOT DETECTED
OPIATES: NOT DETECTED
TETRAHYDROCANNABINOL: NOT DETECTED

## 2016-12-17 LAB — ETHANOL: Alcohol, Ethyl (B): <5 mg/dL (ref <5)

## 2016-12-17 MED ORDER — AMLODIPINE BESYLATE 5 MG PO TABS
10.0000 mg | ORAL_TABLET | Freq: Once | ORAL | Status: AC
  Administered 2016-12-17: 10 mg via ORAL
  Filled 2016-12-17: qty 2

## 2016-12-17 NOTE — ED Provider Notes (Signed)
[facility]-EMERGENCY DEPT Provider Note   CSN: [id number] Arrival date & time: 12/16/16  2144   By signing my name below, I, Clarisse Gouge, attest that this documentation has been prepared under the direction and in the presence of Karas Pickerill, MD. Electronically signed, Clarisse Gouge, ED Scribe. 12/17/16. 1:00 AM.   History   Chief Complaint Chief Complaint  Patient presents with  . Hypertension  . Psychiatric Evaluation   The history is provided by the patient and medical records. No language interpreter was used.  Hypertension  This is a chronic problem. The current episode started more than 1 week ago. The problem occurs constantly. The problem has not changed since onset.Pertinent negatives include no chest pain, no abdominal pain, no headaches and no shortness of breath. Nothing aggravates the symptoms. Nothing relieves the symptoms. Treatments tried: multiple meds. The treatment provided no relief.    Cameron Haney is a 46 y.o. male with h/o HTN and anxiety, self transported via private vehicle to the Emergency Department with concern for anxiety. He expresses concern elevated high blood pressure measured at home. Pt on medications for HTN, depression, DM and psychiatric illness He states his anxiety medication is causing sleep disturbance and nightmares. Last dose of HTN medication unknown, he states his HTN medications were changed 1 month ago. He requests clonopin currently, stating his current psychiatrist will not prescribe more than 5 days' worth to him though his healthcare provider in [place] did and this is what will help his BP which is only elevated secondary to HTN. no other complaints at this time. No SI/HI noted per triage.  Past Medical History:  Diagnosis Date  . Hypertension     Patient Active Problem List   Diagnosis Date Noted  . Pre-diabetes 11/24/2016  . Dyslipidemia 11/24/2016  . GAD (generalized anxiety disorder) 11/01/2016  . Hypertension  01/29/2016  . Hypertensive urgency 01/29/2016    History reviewed. No pertinent surgical history.     Home Medications    Prior to Admission medications   Medication Sig Start Date End Date Taking? Authorizing Provider  carvedilol (COREG) 6.25 MG tablet Take 1 tablet (6.25 mg total) by mouth 2 (two) times daily with a meal. 11/24/16   Georgina Quint, MD  clonazePAM (KLONOPIN) 1 MG tablet Take 1 tablet (1 mg total) by mouth at bedtime as needed for anxiety. 10/13/16   Margarita Grizzle, MD  gabapentin (NEURONTIN) 100 MG capsule Take 100 mg by mouth 3 (three) times daily as needed (anxiety).    Historical Provider, MD  hydrOXYzine (ATARAX/VISTARIL) 25 MG tablet Take 1 tablet (25 mg total) by mouth every 6 (six) hours. 11/10/16   Arthor Captain, PA-C  ibuprofen (ADVIL,MOTRIN) 200 MG tablet Take 200-800 mg by mouth every 6 (six) hours as needed for moderate pain.    Historical Provider, MD  lisinopril (PRINIVIL,ZESTRIL) 20 MG tablet Take 1 tablet (20 mg total) by mouth daily. 11/24/16   Georgina Quint, MD  metFORMIN (GLUCOPHAGE) 500 MG tablet Take 1 tablet (500 mg total) by mouth 2 (two) times daily with a meal. 11/24/16   Georgina Quint, MD  pravastatin (PRAVACHOL) 20 MG tablet Take 1 tablet (20 mg total) by mouth daily. 11/24/16   Georgina Quint, MD    Family History No family history on file.  Social History Social History  Substance Use Topics  . Smoking status: Former Smoker    Quit date: 09/03/2001  . Smokeless tobacco: Never Used  . Alcohol use No  Comment: 3 Bud Light beers daily, last used 09/13/16     Allergies   Aspirin and Other   Review of Systems Review of Systems  Constitutional: Negative for diaphoresis and fever.  Respiratory: Negative for shortness of breath.   Cardiovascular: Negative for chest pain, palpitations and leg swelling.  Gastrointestinal: Negative for abdominal pain.  Neurological: Negative for tremors, seizures, syncope, speech  difficulty, weakness, numbness and headaches.  Psychiatric/Behavioral: Positive for sleep disturbance. Negative for dysphoric mood, hallucinations, self-injury and suicidal ideas. The patient is nervous/anxious. The patient is not hyperactive.   All other systems reviewed and are negative.    Physical Exam Updated Vital Signs BP (!) 172/104 (BP Location: Left Arm)   Pulse 88   Temp 98.5 F (36.9 C) (Oral)   Resp 20   SpO2 100%   Physical Exam  Constitutional: He is oriented to person, place, and time. He appears well-developed and well-nourished. No distress.  HENT:  Head: Normocephalic and atraumatic.  Mouth/Throat: Oropharynx is clear and moist. No oropharyngeal exudate.  Eyes: Conjunctivae and EOM are normal. Pupils are equal, round, and reactive to light.  Neck: Normal range of motion. Neck supple. No JVD present.  Cardiovascular: Normal rate, regular rhythm, normal heart sounds and intact distal pulses.   No murmur heard. Pulmonary/Chest: Effort normal and breath sounds normal. No stridor. No respiratory distress. He has no wheezes. He has no rales.  Abdominal: Soft. Bowel sounds are normal. He exhibits no mass. There is no tenderness. There is no rebound and no guarding.  Musculoskeletal: Normal range of motion. He exhibits no edema.  Neurological: He is alert and oriented to person, place, and time. He displays normal reflexes.  Skin: Skin is warm and dry. Capillary refill takes less than 2 seconds.  Psychiatric: He has a normal mood and affect.  Nursing note and vitals reviewed.    ED Treatments / Results   Vitals:   12/16/16 2147 12/17/16 0001  BP: (!) 195/104 (!) 172/104  Pulse: 88 88  Resp: 20 20  Temp: 98.5 F (36.9 C)     DIAGNOSTIC STUDIES: Oxygen Saturation is 100% on RA, NL by my interpretation.    COORDINATION OF CARE: 12:54 AM-Discussed next steps with pt. Pt verbalized understanding and is agreeable with the plan. Will order medications.    Labs (all labs ordered are listed, but only abnormal results are displayed) Results for orders placed or performed during the hospital encounter of 12/17/16  CBC with Diff  Result Value Ref Range   WBC 10.5 4.0 - 10.5 K/uL   RBC 4.68 4.22 - 5.81 MIL/uL   Hemoglobin 14.6 13.0 - 17.0 g/dL   HCT 13.2 44.0 - 10.2 %   MCV 88.7 78.0 - 100.0 fL   MCH 31.2 26.0 - 34.0 pg   MCHC 35.2 30.0 - 36.0 g/dL   RDW 72.5 36.6 - 44.0 %   Platelets 218 150 - 400 K/uL   Neutrophils Relative % 46 %   Neutro Abs 4.8 1.7 - 7.7 K/uL   Lymphocytes Relative 39 %   Lymphs Abs 4.1 (H) 0.7 - 4.0 K/uL   Monocytes Relative 11 %   Monocytes Absolute 1.1 (H) 0.1 - 1.0 K/uL   Eosinophils Relative 4 %   Eosinophils Absolute 0.4 0.0 - 0.7 K/uL   Basophils Relative 0 %   Basophils Absolute 0.0 0.0 - 0.1 K/uL   No results found.   Procedures Procedures (including critical care time)  Medications Ordered in ED  Medications  amLODipine (NORVASC) tablet 10 mg (not administered)       Final Clinical Impressions(s) / ED Diagnoses  Hypertension: All he wants is Klonopin.  Patient with hypertension. Normal neurologic/psych examination. I had a long discussion with the patient about what I felt was appropriate for prescription from the ER and I have declined to prescribe Klonopin at this time, I stated that the Ed does not refill narcotics and benzos. The patient does not appear to have any significant lab abnormalities. Patient is advised to follow-up with his psychiatrist in [place].   Exam is benign and reassuring.  I do not feel imaging or TTS consult is necessary at this time.Return for fevers, intractable vomiting or pain or any concerns.   After history, exam, and medical workup I feel the patient has been appropriately medically screened and is safe for discharge home. Pertinent diagnoses were discussed with the patient. Patient was given return precautions.  I personally performed the services described in this  documentation, which was scribed in my presence. The recorded information has been reviewed and is accurate.      Cy Blamer, MD 12/17/16 [phone]

## 2016-12-17 NOTE — ED Notes (Signed)
Bed: WA07 Expected date:  Expected time:  Means of arrival:  Comments: 

## 2017-02-21 ENCOUNTER — Encounter: Payer: Self-pay | Admitting: Emergency Medicine

## 2017-02-21 ENCOUNTER — Ambulatory Visit (INDEPENDENT_AMBULATORY_CARE_PROVIDER_SITE_OTHER): Payer: 59

## 2017-02-21 ENCOUNTER — Ambulatory Visit (INDEPENDENT_AMBULATORY_CARE_PROVIDER_SITE_OTHER): Payer: 59 | Admitting: Emergency Medicine

## 2017-02-21 VITALS — BP 118/84 | HR 74 | Temp 98.6°F | Resp 16 | Ht 62.75 in | Wt 208.6 lb

## 2017-02-21 DIAGNOSIS — R05 Cough: Secondary | ICD-10-CM

## 2017-02-21 DIAGNOSIS — R7303 Prediabetes: Secondary | ICD-10-CM | POA: Diagnosis not present

## 2017-02-21 DIAGNOSIS — R058 Other specified cough: Secondary | ICD-10-CM | POA: Insufficient documentation

## 2017-02-21 DIAGNOSIS — I10 Essential (primary) hypertension: Secondary | ICD-10-CM

## 2017-02-21 DIAGNOSIS — T464X5A Adverse effect of angiotensin-converting-enzyme inhibitors, initial encounter: Secondary | ICD-10-CM

## 2017-02-21 DIAGNOSIS — E785 Hyperlipidemia, unspecified: Secondary | ICD-10-CM

## 2017-02-21 DIAGNOSIS — F411 Generalized anxiety disorder: Secondary | ICD-10-CM

## 2017-02-21 DIAGNOSIS — R059 Cough, unspecified: Secondary | ICD-10-CM

## 2017-02-21 LAB — POCT GLYCOSYLATED HEMOGLOBIN (HGB A1C): Hemoglobin A1C: 5.8

## 2017-02-21 MED ORDER — LOSARTAN POTASSIUM 50 MG PO TABS: 50.0000 mg | ORAL_TABLET | Freq: Every day | ORAL | 3 refills | Status: DC

## 2017-02-21 NOTE — Assessment & Plan Note (Signed)
Will change to Losartan 50 mg.

## 2017-02-21 NOTE — Assessment & Plan Note (Signed)
Well controlled 

## 2017-02-21 NOTE — Patient Instructions (Addendum)
     IF you received an x-ray today, you will receive an invoice from [facility]. Please contact [facility] at [phone] with questions or concerns regarding your invoice.   IF you received labwork today, you will receive an invoice from [facility]. Please contact [facility] at [phone] with questions or concerns regarding your invoice.   Our billing staff will not be able to assist you with questions regarding bills from these companies.  You will be contacted with the lab results as soon as they are available. The fastest way to get your results is to activate your My Chart account. Instructions are located on the last page of this paperwork. If you have not heard from us regarding the results in 2 weeks, please contact this office.     Tos en los adultos (Cough, Adult) La tos ayuda a limpiar la garganta y los pulmones. La tos puede durar solo 2 o 3semanas (aguda) o ms de 8semanas (crnica). Las causas de la tos son varias. Puede ser el signo de una enfermedad o de otro trastorno. CUIDADOS EN EL HOGAR  Est atento a cualquier cambio en la tos.  Tome los medicamentos solamente como se lo haya indicado el mdico. ? Si le recetaron un antibitico, tmelo como se lo haya indicado el mdico. No deje de tomarlo aunque comience a sentirse mejor. ? Hable con el mdico antes de probar un medicamento para la tos.  Beba suficiente lquido para mantener el pis (orina) claro o de color amarillo plido.  Si el aire est seco, use un vaporizador o un humidificador con vapor fro en su casa.  Mantngase alejado de las cosas que lo hacen toser en el trabajo o en su casa.  Si la tos aumenta durante la noche, haga la prueba de usar almohadas adicionales para mantener la cabeza ms elevada mientras duerme.  No fume e intente no estar cerca de humo. Si necesita ayuda para dejar de fumar, consulte al mdico.  No consuma cafena.  No beba alcohol.  Descanse todo lo que sea  necesario. SOLICITE AYUDA SI:  Le aparecen problemas (sntomas) nuevos.  Expectora un lquido amarillento (pus) cuando tose.  La tos no mejora despus de 2 o 3semanas, o empeora.  Los medicamentos no le alivian la tos, y no puede dormir bien.  Siente un dolor que se vuelve ms intenso o que no se alivia con los medicamentos.  Tiene fiebre.  Est bajando de peso y no sabe por qu.  Tiene transpiracin nocturna. SOLICITE AYUDA DE INMEDIATO SI:  Tose y escupe sangre.  Tiene dificultad para respirar.  Los latidos cardacos son muy rpidos. Esta informacin no tiene como fin reemplazar el consejo del mdico. Asegrese de hacerle al mdico cualquier pregunta que tenga. Document Released: 04/29/2011 Document Revised: 05/07/2015 Document Reviewed: 10/23/2014 Elsevier Interactive Patient Education  2018 Elsevier Inc.  

## 2017-02-21 NOTE — Assessment & Plan Note (Signed)
Well controlled. A1C 5.8 is acceptable.

## 2017-02-21 NOTE — Assessment & Plan Note (Signed)
Taking Pravachol as prescribed.

## 2017-02-21 NOTE — Progress Notes (Signed)
Cameron Haney 46 y.o.   Chief Complaint  Patient presents with  . Cough    CONTINOUS    HISTORY OF PRESENT ILLNESS: This is a 46 y.o. male complaining of chronic cough, occasionally purulent with specks of blood after coughing spell. On ACE inhibitor x 1 year; feels great otherwise; states much better than previous visits. Overall significantly improved.   HPI   Prior to Admission medications   Medication Sig Start Date End Date Taking? Authorizing Provider  carvedilol (COREG) 6.25 MG tablet Take 1 tablet (6.25 mg total) by mouth 2 (two) times daily with a meal. 11/24/16  Yes Cameron Haney, Cameron Kempf, MD  clonazePAM (KLONOPIN) 1 MG tablet Take 1 tablet (1 mg total) by mouth at bedtime as needed for anxiety. 10/13/16  Yes Cameron Grizzle, MD  lisinopril (PRINIVIL,ZESTRIL) 20 MG tablet Take 1 tablet (20 mg total) by mouth daily. 11/24/16  Yes Cameron Quint, MD  metFORMIN (GLUCOPHAGE) 500 MG tablet Take 1 tablet (500 mg total) by mouth 2 (two) times daily with a meal. 11/24/16  Yes Cameron Haney, Cameron Kempf, MD  pravastatin (PRAVACHOL) 20 MG tablet Take 1 tablet (20 mg total) by mouth daily. 11/24/16  Yes Natividad Schlosser, Cameron Kempf, MD  hydrOXYzine (ATARAX/VISTARIL) 25 MG tablet Take 1 tablet (25 mg total) by mouth every 6 (six) hours. Patient not taking: Reported on 02/21/2017 11/10/16   Cameron Captain, PA-C  ibuprofen (ADVIL,MOTRIN) 200 MG tablet Take 200-800 mg by mouth every 6 (six) hours as needed for moderate pain.    [provider]  meclizine (ANTIVERT) 25 MG tablet Take 25 mg by mouth 3 (three) times daily as needed for dizziness.    [provider]  risperiDONE (RISPERDAL) 1 MG tablet Take 1 mg by mouth daily.    [provider]    Allergies  Allergen Reactions  . Aspirin Other (See Comments)    Dizzy   . Other Other (See Comments)    Neurobion Forte: Skin "fell off"    Patient Active Problem List   Diagnosis Date Noted  . Pre-diabetes  11/24/2016  . Dyslipidemia 11/24/2016  . GAD (generalized anxiety disorder) 11/01/2016  . Hypertension 01/29/2016  . Hypertensive urgency 01/29/2016    Past Medical History:  Diagnosis Date  . Hypertension     No past surgical history on file.  Social History   Social History  . Marital status: Married    Spouse name: Cameron Haney  . Number of children: 3  . Years of education: 12   Occupational History  .      [employer]   Social History Main Topics  . Smoking status: Former Smoker    Quit date: 09/03/2001  . Smokeless tobacco: Never Used  . Alcohol use No     Comment: 3 Bud Light beers daily, last used 09/13/16  . Drug use: No  . Sexual activity: Not on file   Other Topics Concern  . 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   Review of Systems  Constitutional: Negative.  Negative for chills, fever and weight loss.  HENT: Negative.  Negative for congestion, nosebleeds and sore throat.   Eyes: Negative.   Respiratory: Positive for cough, hemoptysis and sputum production. Negative for shortness of breath and wheezing.   Cardiovascular: Negative.  Negative for chest pain, palpitations, claudication and  leg swelling.  Gastrointestinal: Negative for abdominal pain, diarrhea, nausea and vomiting.  Genitourinary: Negative.   Musculoskeletal: Negative for back pain, myalgias and neck pain.  Skin: Negative.  Negative for rash.  Neurological: Negative.  Negative for dizziness and headaches.  Endo/Heme/Allergies: Negative.   All other systems reviewed and are negative.    Vitals:   02/21/17 0818  BP: 118/84  Pulse: 74  Resp: 16  Temp: 98.6 F (37 C)     Physical Exam  Constitutional: He is oriented to person, place, and time. He appears well-developed and well-nourished.  HENT:  Head: Normocephalic and atraumatic.  Eyes:  Conjunctivae and EOM are normal. Pupils are equal, round, and reactive to light.  Neck: Normal range of motion. Neck supple. No JVD present.  Cardiovascular: Normal rate, regular rhythm, normal heart sounds and intact distal pulses.   Pulmonary/Chest: Effort normal and breath sounds normal.  Abdominal: Soft. Bowel sounds are normal. He exhibits no distension. There is no tenderness.  Musculoskeletal: Normal range of motion.  Lymphadenopathy:    He has no cervical adenopathy.  Neurological: He is alert and oriented to person, place, and time. No sensory deficit. He exhibits normal muscle tone.  Skin: Skin is warm and dry. Capillary refill takes less than 2 seconds. No rash noted.  Psychiatric: He has a normal mood and affect. His behavior is normal.  Vitals reviewed.  Results for orders placed or performed in visit on 02/21/17 (from the past 24 hour(s))  POCT glycosylated hemoglobin (Hb A1C)     Status: None   Collection Time: 02/21/17  8:51 AM  Result Value Ref Range   Hemoglobin A1C 5.8    Dg Chest 2 View  Result Date: 02/21/2017 CLINICAL DATA:  Cough. EXAM: CHEST  2 VIEW COMPARISON:  No prior FINDINGS: Mediastinum hilar structures normal. Nipple shadows are noted . Lungs are clear. No pleural effusion or pneumothorax. Degenerative changes thoracic spine . IMPRESSION: No acute abnormality. Electronically Signed   By: Cameron Fus  Register   On: 02/21/2017 08:42     ASSESSMENT & PLAN: Hypertension Well controlled.  Cough due to ACE inhibitor Will change to Losartan 50 mg.  Pre-diabetes Well controlled. A1C 5.8 is acceptable.  GAD (generalized anxiety disorder) Well controlled. Pt reports feeling much better.  Cameron Haney was seen today for cough.  Diagnoses and all orders for this visit:  Cough -     DG Chest 2 View; Future  Pre-diabetes -     POCT glycosylated hemoglobin (Hb A1C)  Essential hypertension  Cough due to ACE inhibitor  GAD (generalized anxiety  disorder)  Dyslipidemia  Other orders -     losartan (COZAAR) 50 MG tablet; Take 1 tablet (50 mg total) by mouth daily.    Patient Instructions       IF you received an x-ray today, you will receive an invoice from [facility]. Please contact [facility] at [phone] with questions or concerns regarding your invoice.   IF you received labwork today, you will receive an invoice from [facility]. Please contact [facility] at [phone] with questions or concerns regarding your invoice.   Our billing staff will not be able to assist you with questions regarding bills from these companies.  You will be contacted with the lab results as soon as they are available. The fastest way to get your results is to activate your My Chart account. Instructions are located on the last page of this paperwork. If you have not heard from [place] regarding the results  in 2 weeks, please contact this office.     Tos en los adultos (Cough, Adult) La tos ayuda a limpiar la garganta y los pulmones. La tos puede durar solo 2 o 3semanas (aguda) o ms de 8semanas (crnica). Las causas de la tos son [place]. Puede ser el signo de [place] enfermedad o de otro trastorno. CUIDADOS EN EL HOGAR  Est atento a cualquier cambio en la tos.  Tome los medicamentos solamente como se lo haya indicado el mdico. ? Si le recetaron un antibitico, tmelo como se lo haya indicado el mdico. No deje de tomarlo aunque comience a sentirse mejor. ? Hable con el mdico antes de probar un medicamento para la tos.  Beba suficiente lquido para mantener el pis (orina) claro o de color amarillo plido.  Si el aire est seco, use un vaporizador o un humidificador con vapor fro en su casa.  Mantngase alejado de las cosas que lo hacen toser en el trabajo o en su casa.  Si la tos aumenta durante la noche, haga la prueba de usar almohadas adicionales para Pharmacologistmantener la cabeza ms elevada mientras duerme.  No fume e  intente no estar cerca de humo. Si necesita ayuda para dejar de fumar, consulte al mdico.  No consuma cafena.  No beba alcohol.  Descanse todo lo que sea necesario. SOLICITE AYUDA SI:  Le aparecen problemas (sntomas) nuevos.  Expectora un lquido amarillento (pus) cuando tose.  La tos no mejora despus de 2 o 3semanas, o empeora.  Los medicamentos no Lowe's Companiesle alivian la tos, y no Stage managerpuede dormir bien.  Siente un dolor que se vuelve ms intenso o que no se BJ'salivia con los medicamentos.  Tiene fiebre.  Est bajando de peso y no sabe por qu.  Tiene transpiracin nocturna. SOLICITE AYUDA DE INMEDIATO SI:  Tose y escupe sangre.  Tiene dificultad para respirar.  Los latidos cardacos son muy rpidos. Esta informacin no tiene Theme park managercomo fin reemplazar el consejo del mdico. Asegrese de hacerle al mdico cualquier pregunta que tenga. Document Released: 04/29/2011 Document Revised: 05/07/2015 Document Reviewed: 10/23/2014 Elsevier Interactive Patient Education  2018 Elsevier Inc.      Edwina BarthMiguel Emmely Bittinger, MD [facility]

## 2017-02-21 NOTE — Assessment & Plan Note (Signed)
Well controlled. Pt reports feeling much better.

## 2017-02-28 ENCOUNTER — Telehealth: Payer: Self-pay | Admitting: Family Medicine

## 2017-02-28 NOTE — Telephone Encounter (Signed)
DR Alvy BimlerSAGARDIA PT WANTING YOU ONLY TO GIVE HIM A CALL ABOUT HIS LAB RESULTS

## 2017-03-01 NOTE — Telephone Encounter (Signed)
Spoke to patient regarding blood results and follow up with me in the office. Wife also a patient of mine.

## 2017-03-01 NOTE — Telephone Encounter (Signed)
Pt would like you to call. Thanks

## 2017-03-01 NOTE — Telephone Encounter (Signed)
Spoke to [place]. Thanks.

## 2017-03-07 ENCOUNTER — Ambulatory Visit: Payer: 59 | Admitting: Emergency Medicine

## 2017-03-08 ENCOUNTER — Emergency Department (HOSPITAL_COMMUNITY)
Admission: EM | Admit: 2017-03-08 | Discharge: 2017-03-09 | Disposition: A | Discharge status: Home / Self Care | Payer: 59 | Attending: Emergency Medicine | Admitting: Emergency Medicine

## 2017-03-08 ENCOUNTER — Encounter (HOSPITAL_COMMUNITY): Payer: Self-pay

## 2017-03-08 DIAGNOSIS — R42 Dizziness and giddiness: Secondary | ICD-10-CM | POA: Diagnosis not present

## 2017-03-08 DIAGNOSIS — Z7984 Long term (current) use of oral hypoglycemic drugs: Secondary | ICD-10-CM | POA: Diagnosis not present

## 2017-03-08 DIAGNOSIS — I10 Essential (primary) hypertension: Secondary | ICD-10-CM | POA: Insufficient documentation

## 2017-03-08 DIAGNOSIS — Z87891 Personal history of nicotine dependence: Secondary | ICD-10-CM | POA: Insufficient documentation

## 2017-03-08 DIAGNOSIS — R030 Elevated blood-pressure reading, without diagnosis of hypertension: Secondary | ICD-10-CM | POA: Diagnosis present

## 2017-03-08 DIAGNOSIS — E119 Type 2 diabetes mellitus without complications: Secondary | ICD-10-CM | POA: Insufficient documentation

## 2017-03-08 LAB — BASIC METABOLIC PANEL
Anion gap: 8 (ref 5–15)
BUN: 15 mg/dL (ref 6–20)
CO2: 25 mmol/L (ref 22–32)
CREATININE: 0.67 mg/dL (ref 0.61–1.24)
Calcium: 8.9 mg/dL (ref 8.9–10.3)
Chloride: 106 mmol/L (ref 101–111)
GFR calc Af Amer: >60 mL/min (ref >60)
Glucose, Bld: 100 mg/dL — ABNORMAL HIGH (ref 65–99)
Potassium: 3.7 mmol/L (ref 3.5–5.1)
SODIUM: 139 mmol/L (ref 135–145)

## 2017-03-08 LAB — URINALYSIS, ROUTINE W REFLEX MICROSCOPIC
Bilirubin Urine: NEGATIVE
Glucose, UA: NEGATIVE mg/dL
Hgb urine dipstick: NEGATIVE
KETONES UR: NEGATIVE mg/dL
LEUKOCYTES UA: NEGATIVE
NITRITE: NEGATIVE
PROTEIN: NEGATIVE mg/dL
Specific Gravity, Urine: 1.001 — ABNORMAL LOW (ref 1.005–1.030)
pH: 7 (ref 5.0–8.0)

## 2017-03-08 LAB — CBC
HCT: 39.4 % (ref 39.0–52.0)
Hemoglobin: 13.9 g/dL (ref 13.0–17.0)
MCH: 30.8 pg (ref 26.0–34.0)
MCHC: 35.3 g/dL (ref 30.0–36.0)
MCV: 87.4 fL (ref 78.0–100.0)
PLATELETS: 212 10*3/uL (ref 150–400)
RBC: 4.51 MIL/uL (ref 4.22–5.81)
RDW: 12.6 % (ref 11.5–15.5)
WBC: 10.4 10*3/uL (ref 4.0–10.5)

## 2017-03-08 LAB — CBG MONITORING, ED: Glucose-Capillary: 100 mg/dL — ABNORMAL HIGH (ref 65–99)

## 2017-03-08 MED ORDER — LABETALOL HCL 5 MG/ML IV SOLN
20.0000 mg | INTRAVENOUS | Status: DC | PRN
  Administered 2017-03-08: 20 mg via INTRAVENOUS
  Filled 2017-03-08: qty 4

## 2017-03-08 NOTE — ED Provider Notes (Signed)
[facility]-EMERGENCY DEPT Provider Note   CSN: [id number] Arrival date & time: 03/08/17  2017     History   Chief Complaint Chief Complaint  Patient presents with  . Hypertension    HPI Cameron Haney is a 46 y.o. male.  Patient with PMH of HTN, DM, HL, presents to the ED with a chief complaint of HTN.  He states that he felt like his BP was running high today and it was making him feel poorly.  He states that this brought him to the ED to have his BP checked.  He reports feeling dizzy/lightheaded and fatigued.  He denies any headache, chest pain, or SOB.  He states that he was recently switched off of Lisinopril due to cough, and was started on Losartan.  He states that since the switch, he has felt like his BP has been elevated.  He denies any other symptoms.    The history is provided by the patient. No language interpreter was used.    Past Medical History:  Diagnosis Date  . Hypertension     Patient Active Problem List   Diagnosis Date Noted  . Cough due to ACE inhibitor 02/21/2017  . Pre-diabetes 11/24/2016  . Dyslipidemia 11/24/2016  . GAD (generalized anxiety disorder) 11/01/2016  . Hypertension 01/29/2016  . Hypertensive urgency 01/29/2016    History reviewed. No pertinent surgical history.     Home Medications    Prior to Admission medications   Medication Sig Start Date End Date Taking? Authorizing Provider  carvedilol (COREG) 6.25 MG tablet Take 1 tablet (6.25 mg total) by mouth 2 (two) times daily with a meal. 11/24/16  Yes Sagardia, Eilleen Kempf, MD  losartan (COZAAR) 50 MG tablet Take 1 tablet (50 mg total) by mouth daily. 02/21/17  Yes Georgina Quint, MD  metFORMIN (GLUCOPHAGE) 500 MG tablet Take 1 tablet (500 mg total) by mouth 2 (two) times daily with a meal. 11/24/16  Yes Sagardia, Eilleen Kempf, MD  pravastatin (PRAVACHOL) 20 MG tablet Take 1 tablet (20 mg total) by mouth daily. 11/24/16  Yes Sagardia, Eilleen Kempf, MD  clonazePAM (KLONOPIN) 1  MG tablet Take 1 tablet (1 mg total) by mouth at bedtime as needed for anxiety. 10/13/16   Margarita Grizzle, MD    Family History History reviewed. No pertinent family history.  Social History Social History  Substance Use Topics  . Smoking status: Former Smoker    Quit date: 09/03/2001  . Smokeless tobacco: Never Used  . Alcohol use No     Comment: 3 Bud Light beers daily, last used 09/13/16     Allergies   Aspirin and Other   Review of Systems Review of Systems  All other systems reviewed and are negative.    Physical Exam Updated Vital Signs BP (!) 181/111 (BP Location: Left Arm)   Pulse 78   Temp 98.7 F (37.1 C) (Oral)   Resp 18   Wt 90.7 kg (200 lb)   SpO2 98%   BMI 35.71 kg/m   Physical Exam  Constitutional: He is oriented to person, place, and time. He appears well-developed and well-nourished.  HENT:  Head: Normocephalic and atraumatic.  Right Ear: External ear normal.  Left Ear: External ear normal.  Eyes: Conjunctivae and EOM are normal. Pupils are equal, round, and reactive to light. Right eye exhibits no discharge. Left eye exhibits no discharge. No scleral icterus.  Neck: Normal range of motion. Neck supple. No JVD present.  No pain with neck flexion,  no meningismus  Cardiovascular: Normal rate, regular rhythm and normal heart sounds.  Exam reveals no gallop and no friction rub.   No murmur heard. Pulmonary/Chest: Effort normal and breath sounds normal. No respiratory distress. He has no wheezes. He has no rales. He exhibits no tenderness.  Abdominal: Soft. He exhibits no distension and no mass. There is no tenderness. There is no rebound and no guarding.  Musculoskeletal: Normal range of motion. He exhibits no edema or tenderness.  Normal gait.  Neurological: He is alert and oriented to person, place, and time. He has normal reflexes.  CN 3-12 intact, normal finger to nose, no pronator drift, sensation and strength intact bilaterally.  Skin: Skin is  warm and dry.  Psychiatric: He has a normal mood and affect. His behavior is normal. Judgment and thought content normal.  Nursing note and vitals reviewed.    ED Treatments / Results  Labs (all labs ordered are listed, but only abnormal results are displayed) Labs Reviewed  BASIC METABOLIC PANEL - Abnormal; Notable for the following:       Result Value   Glucose, Bld 100 (*)    All other components within normal limits  URINALYSIS, ROUTINE W REFLEX MICROSCOPIC - Abnormal; Notable for the following:    Color, Urine COLORLESS (*)    Specific Gravity, Urine 1.001 (*)    All other components within normal limits  CBG MONITORING, ED - Abnormal; Notable for the following:    Glucose-Capillary 100 (*)    All other components within normal limits  CBC    EKG  EKG Interpretation None       Radiology No results found.  Procedures Procedures (including critical care time)  Medications Ordered in ED Medications  labetalol (NORMODYNE,TRANDATE) injection 20 mg (not administered)     Initial Impression / Assessment and Plan / ED Course  I have reviewed the triage vital signs and the nursing notes.  Pertinent labs & imaging results that were available during my care of the patient were reviewed by me and considered in my medical decision making (see chart for details).     Patient with elevated BP and lightheadedness.  Will treat BP and reassess.  Likely suboptimal BP control due to recent change in BP meds.  He will need to follow-up with his PCP to readdress his BP meds.  1:30 AM BP is down, asymptomatic.  Recommend PCP follow-up.  Final Clinical Impressions(s) / ED Diagnoses   Final diagnoses:  Elevated blood pressure reading    New Prescriptions New Prescriptions   No medications on file     Roxy HorsemanBrowning, Brendolyn Stockley, Cordelia Poche-C 03/09/17 0131    Palumbo, April, MD 03/09/17 [phone]

## 2017-03-08 NOTE — ED Triage Notes (Signed)
Pt complains of feeling dizzy and confused today, he states that he ate 2 pieces of chicken at work today on his break and felt dizzy afterwards Pt thinks his blood pressure medication may need adjusting

## 2017-03-09 NOTE — Discharge Instructions (Signed)
You need to follow-up with your primary care doctor to have your BP meds adjusted.

## 2017-06-20 ENCOUNTER — Emergency Department (HOSPITAL_COMMUNITY)
Admission: EM | Admit: 2017-06-20 | Discharge: 2017-06-21 | Disposition: A | Discharge status: Home / Self Care | Payer: Self-pay | Attending: Emergency Medicine | Admitting: Emergency Medicine

## 2017-06-20 ENCOUNTER — Emergency Department (HOSPITAL_COMMUNITY): Payer: Self-pay

## 2017-06-20 ENCOUNTER — Encounter (HOSPITAL_COMMUNITY): Payer: Self-pay

## 2017-06-20 DIAGNOSIS — Z7984 Long term (current) use of oral hypoglycemic drugs: Secondary | ICD-10-CM | POA: Insufficient documentation

## 2017-06-20 DIAGNOSIS — Z87891 Personal history of nicotine dependence: Secondary | ICD-10-CM | POA: Insufficient documentation

## 2017-06-20 DIAGNOSIS — I10 Essential (primary) hypertension: Secondary | ICD-10-CM | POA: Insufficient documentation

## 2017-06-20 DIAGNOSIS — H9313 Tinnitus, bilateral: Secondary | ICD-10-CM | POA: Insufficient documentation

## 2017-06-20 DIAGNOSIS — R42 Dizziness and giddiness: Secondary | ICD-10-CM | POA: Insufficient documentation

## 2017-06-20 DIAGNOSIS — M542 Cervicalgia: Secondary | ICD-10-CM | POA: Insufficient documentation

## 2017-06-20 DIAGNOSIS — E119 Type 2 diabetes mellitus without complications: Secondary | ICD-10-CM | POA: Insufficient documentation

## 2017-06-20 DIAGNOSIS — F419 Anxiety disorder, unspecified: Secondary | ICD-10-CM | POA: Insufficient documentation

## 2017-06-20 DIAGNOSIS — H1132 Conjunctival hemorrhage, left eye: Secondary | ICD-10-CM | POA: Insufficient documentation

## 2017-06-20 DIAGNOSIS — Z79899 Other long term (current) drug therapy: Secondary | ICD-10-CM | POA: Insufficient documentation

## 2017-06-20 HISTORY — DX: Type 2 diabetes mellitus without complications: E11.9

## 2017-06-20 HISTORY — DX: Pure hypercholesterolemia, unspecified: E78.00

## 2017-06-20 LAB — BASIC METABOLIC PANEL
ANION GAP: 10 (ref 5–15)
BUN: 17 mg/dL (ref 6–20)
CALCIUM: 9.4 mg/dL (ref 8.9–10.3)
CO2: 25 mmol/L (ref 22–32)
Chloride: 105 mmol/L (ref 101–111)
Creatinine, Ser: 0.61 mg/dL (ref 0.61–1.24)
GFR calc Af Amer: >60 mL/min (ref >60)
GLUCOSE: 104 mg/dL — AB (ref 65–99)
Potassium: 3.6 mmol/L (ref 3.5–5.1)
Sodium: 140 mmol/L (ref 135–145)

## 2017-06-20 LAB — CBC WITH DIFFERENTIAL/PLATELET
BASOS ABS: 0 10*3/uL (ref 0.0–0.1)
Basophils Relative: 0 %
EOS PCT: 4 %
Eosinophils Absolute: 0.5 10*3/uL (ref 0.0–0.7)
HCT: 41.7 % (ref 39.0–52.0)
Hemoglobin: 14.7 g/dL (ref 13.0–17.0)
LYMPHS PCT: 45 %
Lymphs Abs: 4.8 10*3/uL — ABNORMAL HIGH (ref 0.7–4.0)
MCH: 31.1 pg (ref 26.0–34.0)
MCHC: 35.3 g/dL (ref 30.0–36.0)
MCV: 88.2 fL (ref 78.0–100.0)
MONO ABS: 0.8 10*3/uL (ref 0.1–1.0)
Monocytes Relative: 7 %
Neutro Abs: 4.7 10*3/uL (ref 1.7–7.7)
Neutrophils Relative %: 44 %
PLATELETS: 227 10*3/uL (ref 150–400)
RBC: 4.73 MIL/uL (ref 4.22–5.81)
RDW: 13.2 % (ref 11.5–15.5)
WBC: 10.8 10*3/uL — ABNORMAL HIGH (ref 4.0–10.5)

## 2017-06-20 LAB — I-STAT TROPONIN, ED: Troponin i, poc: 0 ng/mL (ref 0.00–0.08)

## 2017-06-20 LAB — BRAIN NATRIURETIC PEPTIDE: B NATRIURETIC PEPTIDE 5: 31.9 pg/mL (ref 0.0–100.0)

## 2017-06-20 MED ORDER — MELOXICAM 7.5 MG PO TABS: 7.5000 mg | ORAL_TABLET | Freq: Every day | ORAL | 0 refills | Status: DC

## 2017-06-20 MED ORDER — LORAZEPAM 2 MG/ML IJ SOLN
1.0000 mg | Freq: Once | INTRAMUSCULAR | Status: AC
  Administered 2017-06-20: 1 mg via INTRAVENOUS
  Filled 2017-06-20: qty 1

## 2017-06-20 MED ORDER — IOPAMIDOL (ISOVUE-370) INJECTION 76%
100.0000 mL | Freq: Once | INTRAVENOUS | Status: AC | PRN
  Administered 2017-06-20: 100 mL via INTRAVENOUS

## 2017-06-20 MED ORDER — CARVEDILOL 6.25 MG PO TABS
6.2500 mg | ORAL_TABLET | Freq: Once | ORAL | Status: AC
  Administered 2017-06-20: 6.25 mg via ORAL
  Filled 2017-06-20: qty 1

## 2017-06-20 MED ORDER — LISINOPRIL 20 MG PO TABS
20.0000 mg | ORAL_TABLET | Freq: Once | ORAL | Status: AC
  Administered 2017-06-20: 20 mg via ORAL
  Filled 2017-06-20: qty 1

## 2017-06-20 MED ORDER — CARVEDILOL 6.25 MG PO TABS
6.2500 mg | ORAL_TABLET | Freq: Two times a day (BID) | ORAL | Status: DC
  Filled 2017-06-20: qty 1

## 2017-06-20 MED ORDER — IOPAMIDOL (ISOVUE-370) INJECTION 76%
INTRAVENOUS | Status: AC
  Filled 2017-06-20: qty 100

## 2017-06-20 MED ORDER — LABETALOL HCL 5 MG/ML IV SOLN
10.0000 mg | Freq: Once | INTRAVENOUS | Status: AC
  Administered 2017-06-20: 10 mg via INTRAVENOUS
  Filled 2017-06-20: qty 4

## 2017-06-20 NOTE — ED Triage Notes (Signed)
Triage completed using video interpreter Gunnar Fusiaula [phone]#[id number]  Patient c/o left eye pain and states that he had blood yesterday when he woke from his left eye.  Patient also reported that he had the same thing happen 10 months ago and was told he had an inflamed nerve. Patient also c/o right ear pain and describes as butterfly fluttering in his ear.

## 2017-06-20 NOTE — Discharge Instructions (Signed)
TAKE YOUR MEDICATIONS AS PRESCRIBED. IT IS VERY IMPORTANT TO DO THIS TO PREVENT YOUR SYMPTOMS.

## 2017-06-20 NOTE — ED Notes (Signed)
Patient transported to CT 

## 2017-06-20 NOTE — ED Notes (Addendum)
Visual acuity screening preformed, pt able to see same (20/40) with botheyes, right eye, and left eye individually. There is a language barrier but pt able to describe and point and so forth to let me know he could see image. Maddalyn Lutze P Mohit Zirbes

## 2017-06-20 NOTE — ED Provider Notes (Addendum)
[facility]-EMERGENCY DEPT Provider Note   CSN: [id number] Arrival date & time: 06/20/17  1504     History   Chief Complaint No chief complaint on file.   HPI Cameron Haney is a 46 y.o. male.  HPI   46 yo M with HTN, HLD, DM here with HA. Pt endorses generalized HA along with ringing in his ears x 2 days. This began gradually after getting into an argument with his boss. He also noticed that after this, his eyes were "blood shot" and he started bleeding in his left eye. No vision changes, however. He has since felt dizzy/lightheaded and like he has a constant head pressure. No numbness or weakness. Sx worsen when his BP increases. He admits to not being completley adherent with his BP meds. No recent trauma. HA is not worst of his life. Denies any fevers. He does report occasional neck pain with this. No CP, SOB. No abdominal pain. He has chronic knee pain, not acutely worsened.  Past Medical History:  Diagnosis Date  . Diabetes mellitus without complication (HCC)   . High cholesterol   . Hypertension     Patient Active Problem List   Diagnosis Date Noted  . Cough due to ACE inhibitor 02/21/2017  . Pre-diabetes 11/24/2016  . Dyslipidemia 11/24/2016  . GAD (generalized anxiety disorder) 11/01/2016  . Hypertension 01/29/2016  . Hypertensive urgency 01/29/2016    Past Surgical History:  Procedure Laterality Date  . CARDIAC SURGERY         Home Medications    Prior to Admission medications   Medication Sig Start Date End Date Taking? Authorizing Provider  carvedilol (COREG) 6.25 MG tablet Take 1 tablet (6.25 mg total) by mouth 2 (two) times daily with a meal. 11/24/16  Yes Sagardia, Eilleen KempfMiguel Jose, MD  losartan (COZAAR) 50 MG tablet Take 1 tablet (50 mg total) by mouth daily. 02/21/17  Yes Georgina QuintSagardia, Miguel Jose, MD  metFORMIN (GLUCOPHAGE) 500 MG tablet Take 1 tablet (500 mg total) by mouth 2 (two) times daily with a meal. 11/24/16  Yes  Sagardia, Eilleen KempfMiguel Jose, MD  pravastatin (PRAVACHOL) 20 MG tablet Take 1 tablet (20 mg total) by mouth daily. 11/24/16  Yes Sagardia, Eilleen KempfMiguel Jose, MD  amLODipine (NORVASC) 5 MG tablet Take 1 tablet (5 mg total) by mouth daily. 06/21/17   Shaune PollackIsaacs, Nahom Carfagno, MD  clonazePAM (KLONOPIN) 1 MG tablet Take 1 tablet (1 mg total) by mouth at bedtime as needed for anxiety. Patient not taking: Reported on 06/20/2017 10/13/16   Margarita Grizzleay, Danielle, MD  meloxicam (MOBIC) 7.5 MG tablet Take 1 tablet (7.5 mg total) by mouth daily. 06/21/17   Shaune PollackIsaacs, Jesselee Poth, MD    Family History No family history on file.  Social History Social History  Substance Use Topics  . Smoking status: Former Smoker    Quit date: 09/03/2001  . Smokeless tobacco: Never Used  . Alcohol use No     Comment: 3 Bud Light beers daily, last used 09/13/16     Allergies   Aspirin and Other   Review of Systems Review of Systems  Constitutional: Negative for chills, fatigue and fever.  HENT: Negative for congestion and rhinorrhea.   Eyes: Positive for discharge and redness. Negative for visual disturbance.  Respiratory: Negative for cough, shortness of breath and wheezing.   Cardiovascular: Negative for chest pain and leg swelling.  Gastrointestinal: Negative for abdominal pain, diarrhea, nausea and vomiting.  Genitourinary: Negative for dysuria and flank pain.  Musculoskeletal: Negative  for neck pain and neck stiffness.  Skin: Negative for rash and wound.  Allergic/Immunologic: Negative for immunocompromised state.  Neurological: Positive for dizziness and headaches. Negative for syncope and weakness.  All other systems reviewed and are negative.    Physical Exam Updated Vital Signs BP (!) 164/107 (BP Location: Right Arm)   Pulse 75   Temp (!) 97 F (36.1 C) (Oral)   Resp 20   Ht 5\' 6"  (1.676 m)   Wt 90.7 kg (200 lb)   SpO2 100%   BMI 32.28 kg/m   Physical Exam  Constitutional: He is oriented to person, place, and time. He  appears well-developed and well-nourished. No distress.  HENT:  Head: Normocephalic and atraumatic.  Mouth/Throat: Oropharynx is clear and moist.  Eyes: Pupils are equal, round, and reactive to light. EOM are normal.  Pterygium b/l. Small subconj hemorrhage next to left pterygium. PERRL. No hypopyon or hyphemia. Visual acuity at baseline. No proptosis. Globes are soft. PERRL. Normal visual fields b/l.  Neck: Neck supple.  No carotid bruit. No JVD.  Cardiovascular: Normal rate, regular rhythm and normal heart sounds.  Exam reveals no friction rub.   No murmur heard. Pulmonary/Chest: Effort normal and breath sounds normal. No respiratory distress. He has no wheezes. He has no rales.  Abdominal: He exhibits no distension.  Musculoskeletal: He exhibits no edema.  Neurological: He is alert and oriented to person, place, and time. He exhibits normal muscle tone.  Skin: Skin is warm. Capillary refill takes less than 2 seconds.  Psychiatric: He has a normal mood and affect.  Nursing note and vitals reviewed.   Neurological Exam:  Mental Status: Alert and oriented to person, place, and time. Attention and concentration normal. Speech clear. Recent memory is intact. Cranial Nerves: Visual fields grossly intact. EOMI and PERRLA. No nystagmus noted. Facial sensation intact at forehead, maxillary cheek, and chin/mandible bilaterally. No facial asymmetry or weakness. Hearing grossly normal. Uvula is midline, and palate elevates symmetrically. Normal SCM and trapezius strength. Tongue midline without fasciculations. Motor: Muscle strength 5/5 in proximal and distal UE and LE bilaterally. No pronator drift. Muscle tone normal. Reflexes: 2+ and symmetrical in all four extremities.  Sensation: Intact to light touch in upper and lower extremities distally bilaterally.  Gait: Normal without ataxia. Coordination: Normal FTN bilaterally.     ED Treatments / Results  Labs (all labs ordered are listed, but  only abnormal results are displayed) Labs Reviewed  CBC WITH DIFFERENTIAL/PLATELET - Abnormal; Notable for the following:       Result Value   WBC 10.8 (*)    Lymphs Abs 4.8 (*)    All other components within normal limits  BASIC METABOLIC PANEL - Abnormal; Notable for the following:    Glucose, Bld 104 (*)    All other components within normal limits  BRAIN NATRIURETIC PEPTIDE  I-STAT TROPONIN, ED    EKG  EKG Interpretation  Date/Time:  Monday June 20 2017 18:07:02 EDT Ventricular Rate:  66 PR Interval:    QRS Duration: 84 QT Interval:  399 QTC Calculation: 418 R Axis:   32 Text Interpretation:  Sinus rhythm Nonspecific ST depression LVH with secondary repolarization abnormality Confirmed by Shaune Pollack [phone]) on 06/20/2017 6:10:05 PM Also confirmed by Shaune Pollack [phone]), editor Elita Quick ([id number])  on 06/21/2017 7:21:23 AM       Radiology Ct Angio Head W Or Wo Contrast  Result Date: 06/20/2017 CLINICAL DATA:  Initial evaluation for acute headache, visual disturbance. EXAM: CT  ANGIOGRAPHY HEAD AND NECK TECHNIQUE: Multidetector CT imaging of the head and neck was performed using the standard protocol during bolus administration of intravenous contrast. Multiplanar CT image reconstructions and MIPs were obtained to evaluate the vascular anatomy. Carotid stenosis measurements (when applicable) are obtained utilizing NASCET criteria, using the distal internal carotid diameter as the denominator. CONTRAST:  100 cc of Isovue 370. COMPARISON:  Prior CT from 10/13/2016. FINDINGS: CT HEAD FINDINGS Brain: Mildly advanced cerebral atrophy for age. No acute intracranial hemorrhage. No evidence for acute large vessel territory infarct. No mass lesion, midline shift or mass effect. No hydrocephalus. No extra-axial fluid collection. Vascular: No hyperdense vessel. Skull: Scalp soft tissues and calvarium within normal limits. Sinuses: Paranasal sinuses and mastoid air cells are  clear. Orbits: Globes and oval soft tissues normal. Review of the MIP images confirms the above findings CTA NECK FINDINGS Aortic arch: Visualized aortic arch of normal caliber with normal 3 vessel morphology. No flow-limiting stenosis about the origin of the great vessels. Visualized subclavian arteries widely patent. Right carotid system: Right common and internal carotid arteries patent without stenosis, dissection, or occlusion. No significant atheromatous narrowing about the right carotid bifurcation. Left carotid system: Left common and internal carotid arteries patent without stenosis, dissection, or occlusion. No significant atheromatous narrowing about the left carotid bifurcation. Vertebral arteries: Both of the vertebral arteries arise from the subclavian arteries. Vertebral arteries widely patent within the neck without stenosis, dissection, or occlusion. Right vertebral artery dominant. Skeleton: No acute osseus abnormality. No worrisome lytic or blastic osseous lesions. Mild degenerate spondylolysis present at C4-5 through C6-7. Other neck: Soft tissues of the neck demonstrate no acute abnormality. No adenopathy. Thyroid normal. Salivary glands normal. Upper chest: Visualized upper chest within normal limits. Partially visualized lungs are clear. Review of the MIP images confirms the above findings CTA HEAD FINDINGS Anterior circulation: Internal carotid artery is widely patent to the level of the termini without stenosis. Widely patent right A1 segment. Left A1 segment hypoplastic but patent as well. Normal anterior communicating artery. Anterior cerebral arteries patent to their distal aspects without stenosis. M1 segments widely patent without stenosis or occlusion. Normal MCA bifurcations. No proximal M2 stenosis or occlusion. Distal MCA branches well perfused and symmetric. Posterior circulation: Vertebral arteries widely patent to the vertebrobasilar junction. Basilar artery widely patent to its  distal aspect. Superior cerebral arteries patent bilaterally. Both of the posterior cerebral artery supplied via the basilar and are well opacified to their distal aspects without stenosis. Venous sinuses: Patent. Anatomic variants: None significant. No aneurysm or vascular malformation. Delayed phase: No pathologic enhancement. Review of the MIP images confirms the above findings IMPRESSION: 1. Normal CTA of the head and neck. Widely patent circle of Willis with no proximal or large arterial branch occlusion. No significant or correctable stenosis. 2. Mildly advanced cerebral atrophy for age. No acute intracranial abnormality. Electronically Signed   By: Rise Mu M.D.   On: 06/20/2017 19:52   Dg Chest 2 View  Result Date: 06/20/2017 CLINICAL DATA:  Pt C/o of sob, cp AND dizziness for 6 days. Pt also c/o of eye pain, vision trouble, AND jaw pain. Hx of hypertension AND diabetes. Former smoker. EXAM: CHEST  2 VIEW COMPARISON:  Chest x-ray dated 02/21/2017. FINDINGS: The heart size and mediastinal contours are within normal limits. Both lungs are clear. The visualized skeletal structures are unremarkable. IMPRESSION: No active cardiopulmonary disease. Electronically Signed   By: Bary Richard M.D.   On: 06/20/2017 18:37   Ct  Angio Neck W And/or Wo Contrast  Result Date: 06/20/2017 CLINICAL DATA:  Initial evaluation for acute headache, visual disturbance. EXAM: CT ANGIOGRAPHY HEAD AND NECK TECHNIQUE: Multidetector CT imaging of the head and neck was performed using the standard protocol during bolus administration of intravenous contrast. Multiplanar CT image reconstructions and MIPs were obtained to evaluate the vascular anatomy. Carotid stenosis measurements (when applicable) are obtained utilizing NASCET criteria, using the distal internal carotid diameter as the denominator. CONTRAST:  100 cc of Isovue 370. COMPARISON:  Prior CT from 10/13/2016. FINDINGS: CT HEAD FINDINGS Brain: Mildly  advanced cerebral atrophy for age. No acute intracranial hemorrhage. No evidence for acute large vessel territory infarct. No mass lesion, midline shift or mass effect. No hydrocephalus. No extra-axial fluid collection. Vascular: No hyperdense vessel. Skull: Scalp soft tissues and calvarium within normal limits. Sinuses: Paranasal sinuses and mastoid air cells are clear. Orbits: Globes and oval soft tissues normal. Review of the MIP images confirms the above findings CTA NECK FINDINGS Aortic arch: Visualized aortic arch of normal caliber with normal 3 vessel morphology. No flow-limiting stenosis about the origin of the great vessels. Visualized subclavian arteries widely patent. Right carotid system: Right common and internal carotid arteries patent without stenosis, dissection, or occlusion. No significant atheromatous narrowing about the right carotid bifurcation. Left carotid system: Left common and internal carotid arteries patent without stenosis, dissection, or occlusion. No significant atheromatous narrowing about the left carotid bifurcation. Vertebral arteries: Both of the vertebral arteries arise from the subclavian arteries. Vertebral arteries widely patent within the neck without stenosis, dissection, or occlusion. Right vertebral artery dominant. Skeleton: No acute osseus abnormality. No worrisome lytic or blastic osseous lesions. Mild degenerate spondylolysis present at C4-5 through C6-7. Other neck: Soft tissues of the neck demonstrate no acute abnormality. No adenopathy. Thyroid normal. Salivary glands normal. Upper chest: Visualized upper chest within normal limits. Partially visualized lungs are clear. Review of the MIP images confirms the above findings CTA HEAD FINDINGS Anterior circulation: Internal carotid artery is widely patent to the level of the termini without stenosis. Widely patent right A1 segment. Left A1 segment hypoplastic but patent as well. Normal anterior communicating artery.  Anterior cerebral arteries patent to their distal aspects without stenosis. M1 segments widely patent without stenosis or occlusion. Normal MCA bifurcations. No proximal M2 stenosis or occlusion. Distal MCA branches well perfused and symmetric. Posterior circulation: Vertebral arteries widely patent to the vertebrobasilar junction. Basilar artery widely patent to its distal aspect. Superior cerebral arteries patent bilaterally. Both of the posterior cerebral artery supplied via the basilar and are well opacified to their distal aspects without stenosis. Venous sinuses: Patent. Anatomic variants: None significant. No aneurysm or vascular malformation. Delayed phase: No pathologic enhancement. Review of the MIP images confirms the above findings IMPRESSION: 1. Normal CTA of the head and neck. Widely patent circle of Willis with no proximal or large arterial branch occlusion. No significant or correctable stenosis. 2. Mildly advanced cerebral atrophy for age. No acute intracranial abnormality. Electronically Signed   By: Rise Mu M.D.   On: 06/20/2017 19:52   Mr Brain Wo Contrast  Result Date: 06/20/2017 CLINICAL DATA:  Initial evaluation for chronic headache. EXAM: MRI HEAD WITHOUT CONTRAST TECHNIQUE: Multiplanar, multiecho pulse sequences of the brain and surrounding structures were obtained without intravenous contrast. COMPARISON:  Comparison made with prior CTA performed earlier the same day. FINDINGS: Brain: Study mildly degraded by motion artifact. Mildly advanced cerebral atrophy for age. Minimal chronic microvascular ischemic changes present within the  periventricular white matter. No evidence for acute intracranial infarct. Gray-white matter differentiation well maintained. No encephalomalacia to suggest chronic infarction. No foci of susceptibility artifact to suggest acute or chronic intracranial hemorrhage. No mass lesion, midline shift or mass effect. No hydrocephalus. No extra-axial  fluid collection. Major dural sinuses grossly patent. Pituitary gland suprasellar region normal. Midline structures intact and normal. Vascular: Major intracranial vascular flow voids well maintained. Skull and upper cervical spine: Craniocervical junction within normal limits. Visualized upper cervical spine normal. Bone marrow signal intensity within normal limits. No scalp soft tissue abnormality. Sinuses/Orbits: Globes and orbital soft tissues within normal limits. Mild scattered mucosal thickening within the ethmoidal air cells and maxillary sinuses. Superimposed retention cyst noted within the left maxillary sinus. No air-fluid level to suggest acute sinusitis. Mastoid air cells are clear. Inner ear structures grossly normal. Other: None. IMPRESSION: 1. No acute intracranial abnormality identified. 2. Mildly advanced cerebral atrophy with chronic small vessel ischemic disease. Electronically Signed   By: Rise Mu M.D.   On: 06/20/2017 21:52    Procedures Procedures (including critical care time)  Medications Ordered in ED Medications  labetalol (NORMODYNE,TRANDATE) injection 10 mg (10 mg Intravenous Given 06/20/17 1757)  iopamidol (ISOVUE-370) 76 % injection 100 mL (100 mLs Intravenous Contrast Given 06/20/17 1839)  lisinopril (PRINIVIL,ZESTRIL) tablet 20 mg (20 mg Oral Given 06/20/17 2030)  carvedilol (COREG) tablet 6.25 mg (6.25 mg Oral Given 06/20/17 2030)  labetalol (NORMODYNE,TRANDATE) injection 10 mg (10 mg Intravenous Given 06/20/17 2034)  LORazepam (ATIVAN) injection 1 mg (1 mg Intravenous Given 06/20/17 2034)     Initial Impression / Assessment and Plan / ED Course  I have reviewed the triage vital signs and the nursing notes.  Pertinent labs & imaging results that were available during my care of the patient were reviewed by me and considered in my medical decision making (see chart for details).     46 yo M with h/o HTN, HLD, DM here with multiple complaints,  primarily HA, ringing in ears, anxiety, and occasional dizziness. No focal neuro deficits on exam. Pt markedly hypertensive. Concern for HTN urgency, but must also consider PRESS, cerebellar/occult CVA. Labs, imaging ordered. Labetalol and PO meds given for HTN - he admits he as not been taking these regularly. No CP, SOB, numbness/weakness or s/s to suggest ACS or dissection. No abdominal pain.  Labs, imaging as above and are reassuring. Renal function at baseline. Trop neg. CT Angio neg for dissection or acute abnormality and MR w/o stroke. He feels improved with control of BP. Will d/c with outpt follow-up. I encouraged him to take his antiHTN meds as prescribed. He notes that he is not taking the losartan because of cough - will switch to norvasc. No allergy or contraindication. Will trial mobic as well for chronic knee pain - no signs of acute trauma or infectious process.  Final Clinical Impressions(s) / ED Diagnoses   Final diagnoses:  Essential hypertension    New Prescriptions Discharge Medication List as of 06/21/2017 12:04 AM    START taking these medications   Details  amLODipine (NORVASC) 5 MG tablet Take 1 tablet (5 mg total) by mouth daily., Starting Tue 06/21/2017, Print         Shaune Pollack, MD 06/21/17 1610    Shaune Pollack, MD 06/21/17 [phone]

## 2017-06-21 MED ORDER — MELOXICAM 7.5 MG PO TABS: 7.5000 mg | ORAL_TABLET | Freq: Every day | ORAL | 0 refills | Status: DC

## 2017-06-21 MED ORDER — AMLODIPINE BESYLATE 5 MG PO TABS: 5.0000 mg | ORAL_TABLET | Freq: Every day | ORAL | 0 refills | Status: DC

## 2017-07-05 ENCOUNTER — Emergency Department (HOSPITAL_COMMUNITY): Payer: Self-pay

## 2017-07-05 ENCOUNTER — Emergency Department (HOSPITAL_COMMUNITY)
Admission: EM | Admit: 2017-07-05 | Discharge: 2017-07-06 | Disposition: A | Discharge status: Home / Self Care | Payer: Self-pay | Attending: Emergency Medicine | Admitting: Emergency Medicine

## 2017-07-05 ENCOUNTER — Encounter (HOSPITAL_COMMUNITY): Payer: Self-pay

## 2017-07-05 DIAGNOSIS — R9389 Abnormal findings on diagnostic imaging of other specified body structures: Secondary | ICD-10-CM

## 2017-07-05 DIAGNOSIS — I10 Essential (primary) hypertension: Secondary | ICD-10-CM | POA: Insufficient documentation

## 2017-07-05 DIAGNOSIS — E119 Type 2 diabetes mellitus without complications: Secondary | ICD-10-CM | POA: Insufficient documentation

## 2017-07-05 DIAGNOSIS — Z7984 Long term (current) use of oral hypoglycemic drugs: Secondary | ICD-10-CM | POA: Insufficient documentation

## 2017-07-05 DIAGNOSIS — F419 Anxiety disorder, unspecified: Secondary | ICD-10-CM | POA: Insufficient documentation

## 2017-07-05 DIAGNOSIS — Z79899 Other long term (current) drug therapy: Secondary | ICD-10-CM | POA: Insufficient documentation

## 2017-07-05 DIAGNOSIS — H538 Other visual disturbances: Secondary | ICD-10-CM | POA: Insufficient documentation

## 2017-07-05 DIAGNOSIS — Z87891 Personal history of nicotine dependence: Secondary | ICD-10-CM | POA: Insufficient documentation

## 2017-07-05 LAB — BASIC METABOLIC PANEL
ANION GAP: 10 (ref 5–15)
BUN: 18 mg/dL (ref 6–20)
CO2: 25 mmol/L (ref 22–32)
Calcium: 9 mg/dL (ref 8.9–10.3)
Chloride: 106 mmol/L (ref 101–111)
Creatinine, Ser: 0.83 mg/dL (ref 0.61–1.24)
GFR calc Af Amer: >60 mL/min (ref >60)
GFR calc non Af Amer: >60 mL/min (ref >60)
GLUCOSE: 122 mg/dL — AB (ref 65–99)
POTASSIUM: 3.5 mmol/L (ref 3.5–5.1)
Sodium: 141 mmol/L (ref 135–145)

## 2017-07-05 LAB — CBC
HEMATOCRIT: 40.6 % (ref 39.0–52.0)
HEMOGLOBIN: 14.4 g/dL (ref 13.0–17.0)
MCH: 31.2 pg (ref 26.0–34.0)
MCHC: 35.5 g/dL (ref 30.0–36.0)
MCV: 88.1 fL (ref 78.0–100.0)
Platelets: 230 10*3/uL (ref 150–400)
RBC: 4.61 MIL/uL (ref 4.22–5.81)
RDW: 13 % (ref 11.5–15.5)
WBC: 8.7 10*3/uL (ref 4.0–10.5)

## 2017-07-05 LAB — I-STAT TROPONIN, ED: Troponin i, poc: 0 ng/mL (ref 0.00–0.08)

## 2017-07-05 NOTE — ED Notes (Signed)
Pt reports at 1500 today had blurred vision in right eye only that resolved prior to coming  the ED. Then he began having burning in his throat and the to of his abd and palpitations in his left chest. He states that this was [place]

## 2017-07-05 NOTE — ED Triage Notes (Signed)
Pt states that he was eating dinner tonight and got choked on some food, right after that his right eye became blurry and he felt like his heart was racing Pt took his blood pressure medication and his diabetes medication 15 minutes prior to coming tot he ED

## 2017-07-05 NOTE — ED Provider Notes (Signed)
[facility]-EMERGENCY DEPT Provider Note   CSN: [id number] Arrival date & time: 07/05/17  1921     History   Chief Complaint Chief Complaint  Patient presents with  . Blurred Vision    HPI Cameron Haney is a 46 y.o. male.  Patient with a history of DM, HLD, HTN presents with concern for unilateral blurred vision that occurred around 6:00 pm this evening and lasted for 10 minutes. No headache, chest pain, SOB, nausea or vomiting. He reports earlier in the day, around 3:00 he was eating something very spicy and stopped eating because of the burning in his throat but had no other symptoms. He went home and ate dinner with his wife at 6:00 and denies any difficulty eating or swallowing. Soon after eating he experienced visual impairment. He has had similar symptoms in the past, last evaluated on 06/29/17 in this ED. Currently he has no symptoms.    The history is provided by the patient. No language interpreter was used.    Past Medical History:  Diagnosis Date  . Diabetes mellitus without complication (HCC)   . High cholesterol   . Hypertension     Patient Active Problem List   Diagnosis Date Noted  . Cough due to ACE inhibitor 02/21/2017  . Pre-diabetes 11/24/2016  . Dyslipidemia 11/24/2016  . GAD (generalized anxiety disorder) 11/01/2016  . Hypertension 01/29/2016  . Hypertensive urgency 01/29/2016    Past Surgical History:  Procedure Laterality Date  . CARDIAC SURGERY         Home Medications    Prior to Admission medications   Medication Sig Start Date End Date Taking? Authorizing Provider  lisinopril (PRINIVIL,ZESTRIL) 20 MG tablet Take 20 mg daily by mouth.   Yes [provider]  metFORMIN (GLUCOPHAGE) 500 MG tablet Take 1 tablet (500 mg total) by mouth 2 (two) times daily with a meal. 11/24/16  Yes Sagardia, Eilleen KempfMiguel Jose, MD  pravastatin (PRAVACHOL) 20 MG tablet Take 1 tablet (20 mg total) by mouth daily. 11/24/16  Yes  Sagardia, Eilleen KempfMiguel Jose, MD  amLODipine (NORVASC) 5 MG tablet Take 1 tablet (5 mg total) by mouth daily. Patient not taking: Reported on 07/05/2017 06/21/17   Shaune PollackIsaacs, Cameron, MD  carvedilol (COREG) 6.25 MG tablet Take 1 tablet (6.25 mg total) by mouth 2 (two) times daily with a meal. Patient not taking: Reported on 07/05/2017 11/24/16   Georgina QuintSagardia, Miguel Jose, MD  clonazePAM (KLONOPIN) 1 MG tablet Take 1 tablet (1 mg total) by mouth at bedtime as needed for anxiety. Patient not taking: Reported on 06/20/2017 10/13/16   Margarita Grizzleay, Danielle, MD  losartan (COZAAR) 50 MG tablet Take 1 tablet (50 mg total) by mouth daily. Patient not taking: Reported on 07/05/2017 02/21/17   Georgina QuintSagardia, Miguel Jose, MD  meloxicam (MOBIC) 7.5 MG tablet Take 1 tablet (7.5 mg total) by mouth daily. Patient not taking: Reported on 07/05/2017 06/21/17   Shaune PollackIsaacs, Cameron, MD    Family History History reviewed. No pertinent family history.  Social History Social History   Tobacco Use  . Smoking status: Former Smoker    Last attempt to quit: 09/03/2001    Years since quitting: 15.8  . Smokeless tobacco: Never Used  Substance Use Topics  . Alcohol use: No    Comment: 3 Bud Light beers daily, last used 09/13/16  . Drug use: No     Allergies   Aspirin and Other   Review of Systems Review of Systems  Constitutional: Negative for chills  and fever.  HENT: Negative.   Eyes: Positive for visual disturbance. Negative for pain.  Respiratory: Negative.  Negative for shortness of breath.   Cardiovascular: Negative.  Negative for chest pain.  Gastrointestinal: Negative.  Negative for abdominal pain and nausea.  Musculoskeletal: Negative.   Skin: Negative.   Neurological: Negative.  Negative for weakness.     Physical Exam Updated Vital Signs BP (!) 154/98 (BP Location: Left Arm)   Pulse 76   Temp 98.6 F (37 C) (Oral)   Resp 18   SpO2 95%   Physical Exam  Constitutional: He is oriented to person, place, and time. He  appears well-developed and well-nourished.  HENT:  Head: Normocephalic.  Eyes: Conjunctivae are normal. Pupils are equal, round, and reactive to light.  Full and equal vision in all fields. FROM. No nystagmus. No hyphema or corneal cloudiness.   Neck: Normal range of motion. Neck supple.  Cardiovascular: Normal rate and regular rhythm.  No murmur heard. No carotid bruit   Pulmonary/Chest: Effort normal and breath sounds normal. He has no wheezes. He has no rales.  Abdominal: Soft. Bowel sounds are normal. There is no tenderness. There is no rebound and no guarding.  Musculoskeletal: Normal range of motion. He exhibits no edema.  Neurological: He is alert and oriented to person, place, and time.  Skin: Skin is warm and dry. No rash noted.  Psychiatric: He has a normal mood and affect.     ED Treatments / Results  Labs (all labs ordered are listed, but only abnormal results are displayed) Labs Reviewed  BASIC METABOLIC PANEL - Abnormal; Notable for the following components:      Result Value   Glucose, Bld 122 (*)    All other components within normal limits  CBC  I-STAT TROPONIN, ED   Results for orders placed or performed during the hospital encounter of 07/05/17  Basic metabolic panel  Result Value Ref Range   Sodium 141 135 - 145 mmol/L   Potassium 3.5 3.5 - 5.1 mmol/L   Chloride 106 101 - 111 mmol/L   CO2 25 22 - 32 mmol/L   Glucose, Bld 122 (H) 65 - 99 mg/dL   BUN 18 6 - 20 mg/dL   Creatinine, Ser 1.610.83 0.61 - 1.24 mg/dL   Calcium 9.0 8.9 - 09.610.3 mg/dL   GFR calc non Af Amer >60 >60 mL/min   GFR calc Af Amer >60 >60 mL/min   Anion gap 10 5 - 15  CBC  Result Value Ref Range   WBC 8.7 4.0 - 10.5 K/uL   RBC 4.61 4.22 - 5.81 MIL/uL   Hemoglobin 14.4 13.0 - 17.0 g/dL   HCT 04.540.6 40.939.0 - 81.152.0 %   MCV 88.1 78.0 - 100.0 fL   MCH 31.2 26.0 - 34.0 pg   MCHC 35.5 30.0 - 36.0 g/dL   RDW 91.413.0 78.211.5 - 95.615.5 %   Platelets 230 150 - 400 K/uL  I-stat troponin, ED  Result Value  Ref Range   Troponin i, poc 0.00 0.00 - 0.08 ng/mL   Comment 3             EKG Interpretation  Date/Time:  Tuesday July 05 2017 19:29:47 EST Ventricular Rate:  91 PR Interval:    QRS Duration: 80 QT Interval:  351 QTC Calculation: 432 R Axis:   8 Text Interpretation:  Sinus rhythm Probable LVH with secondary repol abnrm ST elevation, consider inferior injury When compared to ECG on 03/08/17, t wave  inversions and t wave depresion in lead 1 appears similar. St elevations in leads  V1-V3 appear similar.  No STEMI Confirmed by Theda Belfast ([id number]) on 07/05/2017 7:37:39 PM       Radiology Dg Chest 2 View  Result Date: 07/05/2017 CLINICAL DATA:  Choking on food with subsequent blurry vision. EXAM: CHEST  2 VIEW COMPARISON:  06/20/2017 FINDINGS: Enlarged cardiac silhouette. Calcific atherosclerotic disease and tortuosity of the aorta. Lucent line along the left heart border. There is no evidence of focal airspace consolidation, pleural effusion or pneumothorax. Osseous structures are without acute abnormality. Soft tissues are grossly normal. IMPRESSION: Lucent line along the left heart border may represent overlapping shadows. However small pneumomediastinum cannot be excluded. Electronically Signed   By: Ted Mcalpine M.D.   On: 07/05/2017 20:05    Procedures Procedures (including critical care time)  Medications Ordered in ED Medications - No data to display   Initial Impression / Assessment and Plan / ED Course  I have reviewed the triage vital signs and the nursing notes.  Pertinent labs & imaging results that were available during my care of the patient were reviewed by me and considered in my medical decision making (see chart for details).     Patient presents with complaint of unilateral blurred vision lasting 10 minutes earlier this evening. No symptoms currently.   Chart reviewed. MR brain performed 06/20/17 when he had similar symptoms was negative.   CXR is  abnormal showing possible pneumomediastinum. He denies any shortness or breath or painful breathing. Reviewed with Dr. Rush Landmark. Will give strict return precautions.  The patient relates he has a significant history of anxiety that causes palpitations when he is worried or stressed as he was earlier tonight. No chest pain. He reports his "nervousness" has been keeping him awake lately. He was previously on clonazepam and that this medication was effective. Discussed Rx'ing #5 clonazepam and strongly encouraged PCP follow up.   Final Clinical Impressions(s) / ED Diagnoses   Final diagnoses:  None   1. Visual impairment, resolved 2. Anxiety 3. Hypertension   ED Discharge Orders    None       Elpidio Anis, PA-C 07/06/17 0012    Tegeler, Canary Brim, MD 07/06/17 [phone]

## 2017-07-06 MED ORDER — CLONAZEPAM 1 MG PO TABS: 1.0000 mg | ORAL_TABLET | Freq: Every evening | ORAL | 0 refills | Status: DC | PRN

## 2017-07-06 NOTE — Discharge Instructions (Signed)
Your chest x-ray shows a small area that appears abnormal and this requires only a repeat x-ray in 1-2 weeks. Return to the emergency department with any shortness of breath or painful breathing. Take medication as prescribed and follow up with your doctor for recheck and further treatment. Discuss your blood pressure during that visit with your doctor as well.

## 2017-09-03 ENCOUNTER — Emergency Department (HOSPITAL_COMMUNITY)
Admission: EM | Admit: 2017-09-03 | Discharge: 2017-09-03 | Discharge status: Against Medical Advice | Payer: Self-pay | Attending: Emergency Medicine | Admitting: Emergency Medicine

## 2017-09-03 ENCOUNTER — Encounter (HOSPITAL_COMMUNITY): Payer: Self-pay | Admitting: Emergency Medicine

## 2017-09-03 DIAGNOSIS — R51 Headache: Secondary | ICD-10-CM | POA: Insufficient documentation

## 2017-09-03 DIAGNOSIS — Z5321 Procedure and treatment not carried out due to patient leaving prior to being seen by health care provider: Secondary | ICD-10-CM | POA: Insufficient documentation

## 2017-09-03 NOTE — ED Notes (Signed)
Per registration patient left AMA 

## 2017-09-03 NOTE — ED Triage Notes (Signed)
Patient here from home with complaints of headache that started Thursday. Reports that he does drink alcohol "but not too much" Also states that he cannot control his blood pressure. Elevated today.

## 2017-09-03 NOTE — ED Notes (Signed)
No answer when called to reassess vitals x2. 

## 2017-09-03 NOTE — ED Notes (Signed)
No answer when called to reassess vitals x1 

## 2017-09-22 ENCOUNTER — Telehealth: Payer: Self-pay | Admitting: Emergency Medicine

## 2017-09-22 NOTE — Telephone Encounter (Signed)
Message sent to Dr. Sagardia 

## 2017-09-22 NOTE — Telephone Encounter (Signed)
Copied from CRM [id number]. Topic: General - Other >> Sep 22, 2017 10:47 AM Gerrianne ScalePayne, Latayvia Mandujano L wrote: Reason for CRM: patient calling stating that someone had changed his blood pressure medicine I advised pt that he need to f/u with Sagardia from Hospital pt states that he works late and that he can't come in not even after 5:30 which I gave him options he would like for Sagardia to give him a call about blood pressure medicine pt hasn't been seen since June 2018 he has been to the ER recently and left out

## 2017-09-22 NOTE — Telephone Encounter (Signed)
Needs OV.  Thanks. 

## 2017-09-23 ENCOUNTER — Telehealth: Payer: Self-pay

## 2017-09-23 NOTE — Telephone Encounter (Signed)
Copied from CRM [id number]. Topic: General - Other >> Sep 23, 2017  3:02 PM Oneal GroutSebastian, Jennifer S wrote: Reason for CRM: Would like to speak w/Dr Alvy BimlerSagardia, would not state why

## 2017-09-29 NOTE — Telephone Encounter (Signed)
Pt states he unable to come in because he does not insurance. States that when he has the money he will schedule an OV.

## 2017-09-29 NOTE — Telephone Encounter (Signed)
Please call pt

## 2017-12-23 ENCOUNTER — Other Ambulatory Visit: Payer: Self-pay | Admitting: Emergency Medicine

## 2018-01-04 ENCOUNTER — Emergency Department (HOSPITAL_COMMUNITY): Payer: Self-pay

## 2018-01-04 ENCOUNTER — Ambulatory Visit (HOSPITAL_COMMUNITY)
Admit: 2018-01-04 | Discharge: 2018-01-04 | Disposition: A | Discharge status: Home / Self Care | Payer: MEDICAID | Attending: Physician Assistant | Admitting: Physician Assistant

## 2018-01-04 ENCOUNTER — Observation Stay (HOSPITAL_COMMUNITY)
Admission: EM | Admit: 2018-01-04 | Discharge: 2018-01-04 | Disposition: A | Discharge status: Home / Self Care | Payer: Self-pay | Attending: Family Medicine | Admitting: Family Medicine

## 2018-01-04 ENCOUNTER — Ambulatory Visit (HOSPITAL_COMMUNITY)
Admit: 2018-01-04 | Discharge: 2018-01-04 | Disposition: A | Discharge status: Home / Self Care | Payer: Self-pay | Source: Ambulatory Visit | Attending: Physician Assistant | Admitting: Physician Assistant

## 2018-01-04 ENCOUNTER — Other Ambulatory Visit: Payer: Self-pay

## 2018-01-04 ENCOUNTER — Encounter (HOSPITAL_COMMUNITY): Payer: Self-pay | Admitting: Emergency Medicine

## 2018-01-04 DIAGNOSIS — Z87891 Personal history of nicotine dependence: Secondary | ICD-10-CM | POA: Insufficient documentation

## 2018-01-04 DIAGNOSIS — R0789 Other chest pain: Secondary | ICD-10-CM | POA: Insufficient documentation

## 2018-01-04 DIAGNOSIS — I160 Hypertensive urgency: Principal | ICD-10-CM | POA: Insufficient documentation

## 2018-01-04 DIAGNOSIS — R0602 Shortness of breath: Secondary | ICD-10-CM | POA: Insufficient documentation

## 2018-01-04 DIAGNOSIS — R079 Chest pain, unspecified: Secondary | ICD-10-CM

## 2018-01-04 DIAGNOSIS — Z7984 Long term (current) use of oral hypoglycemic drugs: Secondary | ICD-10-CM | POA: Insufficient documentation

## 2018-01-04 DIAGNOSIS — E7800 Pure hypercholesterolemia, unspecified: Secondary | ICD-10-CM | POA: Insufficient documentation

## 2018-01-04 DIAGNOSIS — G4733 Obstructive sleep apnea (adult) (pediatric): Secondary | ICD-10-CM | POA: Insufficient documentation

## 2018-01-04 DIAGNOSIS — Z886 Allergy status to analgesic agent status: Secondary | ICD-10-CM | POA: Insufficient documentation

## 2018-01-04 DIAGNOSIS — I2510 Atherosclerotic heart disease of native coronary artery without angina pectoris: Secondary | ICD-10-CM | POA: Insufficient documentation

## 2018-01-04 DIAGNOSIS — F1020 Alcohol dependence, uncomplicated: Secondary | ICD-10-CM | POA: Insufficient documentation

## 2018-01-04 DIAGNOSIS — F411 Generalized anxiety disorder: Secondary | ICD-10-CM | POA: Insufficient documentation

## 2018-01-04 DIAGNOSIS — I10 Essential (primary) hypertension: Secondary | ICD-10-CM | POA: Insufficient documentation

## 2018-01-04 DIAGNOSIS — I259 Chronic ischemic heart disease, unspecified: Secondary | ICD-10-CM

## 2018-01-04 DIAGNOSIS — E119 Type 2 diabetes mellitus without complications: Secondary | ICD-10-CM | POA: Insufficient documentation

## 2018-01-04 DIAGNOSIS — I209 Angina pectoris, unspecified: Secondary | ICD-10-CM

## 2018-01-04 DIAGNOSIS — Z79899 Other long term (current) drug therapy: Secondary | ICD-10-CM | POA: Insufficient documentation

## 2018-01-04 HISTORY — DX: Anxiety disorder, unspecified: F41.9

## 2018-01-04 LAB — CBC WITH DIFFERENTIAL/PLATELET
BASOS ABS: 0 10*3/uL (ref 0.0–0.1)
BASOS PCT: 0 %
Eosinophils Absolute: 0.5 10*3/uL (ref 0.0–0.7)
Eosinophils Relative: 5 %
HEMATOCRIT: 43.5 % (ref 39.0–52.0)
HEMOGLOBIN: 15.2 g/dL (ref 13.0–17.0)
Lymphocytes Relative: 42 %
Lymphs Abs: 4 10*3/uL (ref 0.7–4.0)
MCH: 31.3 pg (ref 26.0–34.0)
MCHC: 34.9 g/dL (ref 30.0–36.0)
MCV: 89.7 fL (ref 78.0–100.0)
MONO ABS: 0.8 10*3/uL (ref 0.1–1.0)
Monocytes Relative: 9 %
NEUTROS ABS: 4.2 10*3/uL (ref 1.7–7.7)
NEUTROS PCT: 44 %
Platelets: 216 10*3/uL (ref 150–400)
RBC: 4.85 MIL/uL (ref 4.22–5.81)
RDW: 13 % (ref 11.5–15.5)
WBC: 9.5 10*3/uL (ref 4.0–10.5)

## 2018-01-04 LAB — LIPID PANEL
Cholesterol: 163 mg/dL (ref 0–200)
HDL: 47 mg/dL (ref >40)
LDL CALC: 105 mg/dL — AB (ref 0–99)
Total CHOL/HDL Ratio: 3.5 RATIO
Triglycerides: 53 mg/dL (ref <150)
VLDL: 11 mg/dL (ref 0–40)

## 2018-01-04 LAB — CBC
HEMATOCRIT: 43.1 % (ref 39.0–52.0)
HEMOGLOBIN: 15.4 g/dL (ref 13.0–17.0)
MCH: 31.9 pg (ref 26.0–34.0)
MCHC: 35.7 g/dL (ref 30.0–36.0)
MCV: 89.2 fL (ref 78.0–100.0)
Platelets: 203 10*3/uL (ref 150–400)
RBC: 4.83 MIL/uL (ref 4.22–5.81)
RDW: 13.1 % (ref 11.5–15.5)
WBC: 7.3 10*3/uL (ref 4.0–10.5)

## 2018-01-04 LAB — BASIC METABOLIC PANEL
Anion gap: 9 (ref 5–15)
BUN: 11 mg/dL (ref 6–20)
CHLORIDE: 107 mmol/L (ref 101–111)
CO2: 23 mmol/L (ref 22–32)
Calcium: 8.5 mg/dL — ABNORMAL LOW (ref 8.9–10.3)
Creatinine, Ser: 0.64 mg/dL (ref 0.61–1.24)
GFR calc Af Amer: >60 mL/min (ref >60)
GFR calc non Af Amer: >60 mL/min (ref >60)
Glucose, Bld: 121 mg/dL — ABNORMAL HIGH (ref 65–99)
POTASSIUM: 3.5 mmol/L (ref 3.5–5.1)
SODIUM: 139 mmol/L (ref 135–145)

## 2018-01-04 LAB — BRAIN NATRIURETIC PEPTIDE: B NATRIURETIC PEPTIDE 5: 68.4 pg/mL (ref 0.0–100.0)

## 2018-01-04 LAB — I-STAT TROPONIN, ED: TROPONIN I, POC: 0 ng/mL (ref 0.00–0.08)

## 2018-01-04 LAB — CREATININE, SERUM: Creatinine, Ser: 0.57 mg/dL — ABNORMAL LOW (ref 0.61–1.24)

## 2018-01-04 LAB — TSH: TSH: 1.822 u[IU]/mL (ref 0.350–4.500)

## 2018-01-04 LAB — TROPONIN I
TROPONIN I: 0.05 ng/mL — AB (ref <0.03)
TROPONIN I: 0.05 ng/mL — AB (ref <0.03)
Troponin I: 0.05 ng/mL (ref <0.03)

## 2018-01-04 LAB — D-DIMER, QUANTITATIVE: D-Dimer, Quant: <0.27 ug/mL-FEU (ref 0.00–0.50)

## 2018-01-04 MED ORDER — ASPIRIN 81 MG PO CHEW
324.0000 mg | CHEWABLE_TABLET | Freq: Once | ORAL | Status: AC
  Administered 2018-01-04: 324 mg via ORAL
  Filled 2018-01-04: qty 4

## 2018-01-04 MED ORDER — CARVEDILOL 6.25 MG PO TABS
6.2500 mg | ORAL_TABLET | Freq: Two times a day (BID) | ORAL | Status: DC
  Filled 2018-01-04: qty 1

## 2018-01-04 MED ORDER — AMLODIPINE BESYLATE 5 MG PO TABS: 5.0000 mg | ORAL_TABLET | Freq: Every day | ORAL | 2 refills | Status: DC

## 2018-01-04 MED ORDER — CARVEDILOL 25 MG PO TABS
25.0000 mg | ORAL_TABLET | Freq: Two times a day (BID) | ORAL | Status: DC
  Administered 2018-01-04: 25 mg via ORAL
  Filled 2018-01-04 (×2): qty 1

## 2018-01-04 MED ORDER — CLONAZEPAM 1 MG PO TABS: 1.0000 mg | ORAL_TABLET | Freq: Two times a day (BID) | ORAL | 0 refills | Status: DC

## 2018-01-04 MED ORDER — ONDANSETRON HCL 4 MG/2ML IJ SOLN: 4.0000 mg | Freq: Four times a day (QID) | INTRAMUSCULAR | Status: DC | PRN

## 2018-01-04 MED ORDER — IOPAMIDOL (ISOVUE-370) INJECTION 76%
100.0000 mL | Freq: Once | INTRAVENOUS | Status: AC | PRN
  Administered 2018-01-04: 80 mL via INTRAVENOUS

## 2018-01-04 MED ORDER — ACETAMINOPHEN 325 MG PO TABS
650.0000 mg | ORAL_TABLET | ORAL | Status: DC | PRN
  Administered 2018-01-04: 650 mg via ORAL
  Filled 2018-01-04: qty 2

## 2018-01-04 MED ORDER — METOPROLOL TARTRATE 5 MG/5ML IV SOLN
INTRAVENOUS | Status: AC
  Filled 2018-01-04: qty 20

## 2018-01-04 MED ORDER — NITROGLYCERIN 0.4 MG SL SUBL
0.4000 mg | SUBLINGUAL_TABLET | SUBLINGUAL | Status: DC | PRN
  Administered 2018-01-04 (×2): 0.8 mg via SUBLINGUAL

## 2018-01-04 MED ORDER — PRAVASTATIN SODIUM 20 MG PO TABS
20.0000 mg | ORAL_TABLET | Freq: Every day | ORAL | Status: DC
  Administered 2018-01-04: 20 mg via ORAL
  Filled 2018-01-04 (×2): qty 1

## 2018-01-04 MED ORDER — CLONAZEPAM 1 MG PO TABS
1.0000 mg | ORAL_TABLET | Freq: Two times a day (BID) | ORAL | Status: DC
  Administered 2018-01-04: 1 mg via ORAL
  Filled 2018-01-04: qty 2

## 2018-01-04 MED ORDER — METOPROLOL TARTRATE 5 MG/5ML IV SOLN
5.0000 mg | INTRAVENOUS | Status: DC | PRN
  Administered 2018-01-04: 5 mg via INTRAVENOUS

## 2018-01-04 MED ORDER — HYDRALAZINE HCL 20 MG/ML IJ SOLN: 10.0000 mg | INTRAMUSCULAR | Status: DC | PRN

## 2018-01-04 MED ORDER — HYDRALAZINE HCL 20 MG/ML IJ SOLN
20.0000 mg | Freq: Once | INTRAMUSCULAR | Status: AC
  Administered 2018-01-04: 20 mg via INTRAVENOUS
  Filled 2018-01-04: qty 1

## 2018-01-04 MED ORDER — HEPARIN SODIUM (PORCINE) 5000 UNIT/ML IJ SOLN
5000.0000 [IU] | Freq: Three times a day (TID) | INTRAMUSCULAR | Status: DC
  Administered 2018-01-04: 5000 [IU] via SUBCUTANEOUS
  Filled 2018-01-04 (×3): qty 1

## 2018-01-04 MED ORDER — NITROGLYCERIN 0.4 MG SL SUBL
SUBLINGUAL_TABLET | SUBLINGUAL | Status: AC
  Filled 2018-01-04: qty 2

## 2018-01-04 MED ORDER — CARVEDILOL 25 MG PO TABS: 25.0000 mg | ORAL_TABLET | Freq: Two times a day (BID) | ORAL | 2 refills | Status: DC

## 2018-01-04 MED ORDER — LOSARTAN POTASSIUM 50 MG PO TABS
50.0000 mg | ORAL_TABLET | Freq: Every day | ORAL | Status: DC
  Administered 2018-01-04: 50 mg via ORAL
  Filled 2018-01-04 (×3): qty 1
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
## 2018-01-04 NOTE — ED Provider Notes (Signed)
[facility]-EMERGENCY DEPT Provider Note   CSN: [id number] Arrival date & time: 01/04/18  0248     History   Chief Complaint Chief Complaint  Patient presents with  . Shortness of Breath  . Chest Pain    HPI Cameron Haney is a 47 y.o. male.  The history is provided by the patient.  Shortness of Breath  Associated symptoms include chest pain.  Chest Pain   Associated symptoms include shortness of breath.  He has history of hypertension, diabetes, hyperlipidemia and comes in with a one-week history of shortness of breath and chest tightness.  He works at [employer] and states that symptoms tend to come on when he is using his hands, but he has had episodes of paroxysmal nocturnal dyspnea.  Symptoms also seem to come on with walking.  He states he feels like he needs to take a deep breath through his mouth, but is unable to do so.  He denies nausea or vomiting.  He has had episodes of diaphoresis at work.  He states he had been on blood pressure medicine in the past, but was taken off of it and he does not know why.  He does take medication for diabetes and hyperlipidemia.  He is a non-smoker and he denies family history of premature coronary atherosclerosis.  Past Medical History:  Diagnosis Date  . Diabetes mellitus without complication (HCC)   . High cholesterol   . Hypertension     Patient Active Problem List   Diagnosis Date Noted  . Cough due to ACE inhibitor 02/21/2017  . Pre-diabetes 11/24/2016  . Dyslipidemia 11/24/2016  . GAD (generalized anxiety disorder) 11/01/2016  . Hypertension 01/29/2016  . Hypertensive urgency 01/29/2016    Past Surgical History:  Procedure Laterality Date  . CARDIAC SURGERY          Home Medications    Prior to Admission medications   Medication Sig Start Date End Date Taking? Authorizing Provider  amLODipine (NORVASC) 5 MG tablet Take 1 tablet (5 mg total) by mouth daily. Patient not taking:  Reported on 07/05/2017 06/21/17   Shaune Pollack, MD  carvedilol (COREG) 6.25 MG tablet TAKE 1 TABLET BY MOUTH TWICE DAILY WITH MEALS 12/26/17   Sagardia, Eilleen Kempf, MD  clonazePAM (KLONOPIN) 1 MG tablet Take 1 tablet (1 mg total) at bedtime as needed by mouth for anxiety. 07/06/17   Elpidio Anis, PA-C  lisinopril (PRINIVIL,ZESTRIL) 20 MG tablet Take 20 mg daily by mouth.    [provider]  losartan (COZAAR) 50 MG tablet Take 1 tablet (50 mg total) by mouth daily. Patient not taking: Reported on 07/05/2017 02/21/17   Georgina Quint, MD  meloxicam (MOBIC) 7.5 MG tablet Take 1 tablet (7.5 mg total) by mouth daily. Patient not taking: Reported on 07/05/2017 06/21/17   Shaune Pollack, MD  metFORMIN (GLUCOPHAGE) 500 MG tablet TAKE 1 TABLET BY MOUTH TWICE DAILY WITH A MEAL 12/26/17   Georgina Quint, MD  pravastatin (PRAVACHOL) 20 MG tablet TAKE 1 TABLET BY MOUTH ONCE DAILY 12/26/17   Georgina Quint, MD    Family History History reviewed. No pertinent family history.  Social History Social History   Tobacco Use  . Smoking status: Former Smoker    Last attempt to quit: 09/03/2001    Years since quitting: 16.3  . Smokeless tobacco: Never Used  Substance Use Topics  . Alcohol use: Yes    Comment: 3 Bud Light beers daily,   .  Drug use: No     Allergies   Aspirin and Other   Review of Systems Review of Systems  Respiratory: Positive for shortness of breath.   Cardiovascular: Positive for chest pain.  All other systems reviewed and are negative.    Physical Exam Updated Vital Signs BP (!) 174/118 (BP Location: Right Arm)   Pulse 70   Temp 98.1 F (36.7 C) (Oral)   Resp 16   Ht 5' 2.21" (1.58 m)   Wt 90.7 kg (200 lb)   SpO2 100%   BMI 36.34 kg/m   Physical Exam  Nursing note and vitals reviewed.  47 year old male, resting comfortably and in no acute distress. Vital signs are significant for markedly elevated blood pressure. Oxygen saturation is  100%, which is normal. Head is normocephalic and atraumatic. PERRLA, EOMI. Oropharynx is clear. Neck is nontender and supple without adenopathy or JVD. Back is nontender and there is no CVA tenderness. Lungs are clear without rales, wheezes, or rhonchi. Chest is nontender. Heart has regular rate and rhythm without murmur. Abdomen is soft, flat, nontender without masses or hepatosplenomegaly and peristalsis is normoactive. Extremities have trace edema, full range of motion is present.  Mild venous stasis changes are present. Skin is warm and dry without rash. Neurologic: Mental status is normal, cranial nerves are intact, there are no motor or sensory deficits.  ED Treatments / Results  Labs (all labs ordered are listed, but only abnormal results are displayed) Labs Reviewed  BASIC METABOLIC PANEL - Abnormal; Notable for the following components:      Result Value   Glucose, Bld 121 (*)    Calcium 8.5 (*)    All other components within normal limits  CBC WITH DIFFERENTIAL/PLATELET  D-DIMER, QUANTITATIVE (NOT AT [facility])  BRAIN NATRIURETIC PEPTIDE  I-STAT TROPONIN, ED    EKG EKG Interpretation  Date/Time:  Wednesday Jan 04 2018 03:32:21 EDT Ventricular Rate:  72 PR Interval:    QRS Duration: 86 QT Interval:  432 QTC Calculation: 473 R Axis:   23 Text Interpretation:  Sinus rhythm ST elevation in Anterior leads T wave inversion Inferior leads ,  Anterolateral leads When compared with ECG of 07/05/2017, T wave inversion is now present Similar  T wave inversion had been seen in earlier ECG's Confirmed by Dione Booze ([id number]) on 01/04/2018 3:41:07 AM   Radiology No results found.  Procedures Procedures  CRITICAL CARE Performed by: Dione Booze Total critical care time: 35 minutes Critical care time was exclusive of separately billable procedures and treating other patients. Critical care was necessary to treat or prevent imminent or life-threatening deterioration. Critical care  was time spent personally by me on the following activities: development of treatment plan with patient and/or surrogate as well as nursing, discussions with consultants, evaluation of patient's response to treatment, examination of patient, obtaining history from patient or surrogate, ordering and performing treatments and interventions, ordering and review of laboratory studies, ordering and review of radiographic studies, pulse oximetry and re-evaluation of patient's condition.  Medications Ordered in ED Medications  hydrALAZINE (APRESOLINE) injection 20 mg (has no administration in time range)     Initial Impression / Assessment and Plan / ED Course  I have reviewed the triage vital signs and the nursing notes.  Pertinent labs & imaging results that were available during my care of the patient were reviewed by me and considered in my medical decision making (see chart for details).  Dyspnea and chest discomfort which do appear to  be exertional and are very worrisome for coronary artery disease.  Uncontrolled hypertension, possible hypertensive crisis.  Old records are reviewed, and [facility] but was lost to follow-up.  I do not see any time were blood pressure medication was discontinued by his primary care provider.  Heart score is 6 which puts him at moderate risk for major adverse cardiac events in the next 30 days.  ECG shows ST elevation in the anterior leads, but this had been present previously.  There is rather deep T wave inversions in the inferior and anterolateral leads which was not present on the last ECG, but have been present on prior ECGs.  Overall picture is concerning for hypertensive crisis.  He is given intravenous hydralazine for blood pressure control.  Aspirin is not given because of allergy.  Screening labs are obtained including BNP and d-dimer.  5:12 AM Blood pressure has come down following allysine.  He  is feeling better.  Troponin is normal, BNP pending.  Chest x-ray looks unchanged from prior per my reading, radiologist interpretation pending.  Case is discussed with Dr. Katrinka Blazing.  His allergy to aspirin is reported to be dizziness, and he had received aspirin when he had previously been admitted with an elevated troponin.  He is given a dose of aspirin.  Dr. Katrinka Blazing is agreed to admit the patient for serial troponins and to establish what antihypertensive medication he should be on.  Final Clinical Impressions(s) / ED Diagnoses   Final diagnoses:  Exertional chest pain  Hypertensive urgency    ED Discharge Orders    None       Dione Booze, MD 01/04/18 [phone]

## 2018-01-04 NOTE — Consult Note (Addendum)
Cardiology Consultation:   Patient ID: Cameron Haney; [id number]; [birth year]   Admit date: 01/04/2018 Date of Consult: 01/04/2018  Primary Care Provider: Patient, No Pcp Per Primary Cardiologist: New, Dr C Primary Electrophysiologist:  n/a   Patient Profile:   Cameron Haney is a 47 y.o. male with a hx of HTN, HLD, DM, who is being seen today for the evaluation of chest pain at the request of Dr Mahala Menghini.  History of Present Illness:   Cameron. Clarisa Fling Haney was in his USOH. When he drinks beer, he will get SOB and has been to the ER for this. He will also get chest tightness. He left AMA in January 2019.  Cameron Haney developed chest tightness a week ago. Sx started at rest, chest tightness has been 10/10 since it started. He has not tried any rx specifically for this.   He has also had problems w/ nasal congestion and has been mouth-breathing. When he takes a deep breath, he has pain in the lower R ribs and in his back.   Sx finally worsened to the point that he came to the ER. In the ER, his sx got better with hydralazine 20 mg IV.   Currently the CP is 10/10. Has not been a 0/10 since it started.   His BP meds were recently changed but something went wrong, he did not get one of them. He has only been on Coreg recently.    Past Medical History:  Diagnosis Date  . Anxiety   . Diabetes mellitus without complication (HCC)   . High cholesterol   . Hypertension     Past Surgical History:  Procedure Laterality Date  . CARDIAC CATHETERIZATION  2016   Most likely procedure, based on his description. Not long after arriving here. They told him nothing was wrong with his heart.     Prior to Admission medications   Medication Sig Start Date End Date Taking? Authorizing Provider  carvedilol (COREG) 6.25 MG tablet TAKE 1 TABLET BY MOUTH TWICE DAILY WITH MEALS 12/26/17  Yes Sagardia, Eilleen Kempf, MD  metFORMIN (GLUCOPHAGE) 500 MG tablet TAKE 1 TABLET BY MOUTH TWICE  DAILY WITH A MEAL 12/26/17  Yes Sagardia, Eilleen Kempf, MD  pravastatin (PRAVACHOL) 20 MG tablet TAKE 1 TABLET BY MOUTH ONCE DAILY 12/26/17  Yes Sagardia, Eilleen Kempf, MD  amLODipine (NORVASC) 5 MG tablet Take 1 tablet (5 mg total) by mouth daily. Patient not taking: Reported on 07/05/2017 06/21/17   Shaune Pollack, MD  clonazePAM (KLONOPIN) 1 MG tablet Take 1 tablet (1 mg total) at bedtime as needed by mouth for anxiety. Patient not taking: Reported on 01/04/2018 07/06/17   Elpidio Anis, PA-C  losartan (COZAAR) 50 MG tablet Take 1 tablet (50 mg total) by mouth daily. Patient not taking: Reported on 07/05/2017 02/21/17   Georgina Quint, MD  meloxicam (MOBIC) 7.5 MG tablet Take 1 tablet (7.5 mg total) by mouth daily. Patient not taking: Reported on 07/05/2017 06/21/17   Shaune Pollack, MD    Inpatient Medications: Scheduled Meds: . carvedilol  25 mg Oral BID WC  . clonazePAM  1 mg Oral BID  . heparin  5,000 Units Subcutaneous Q8H  . losartan  50 mg Oral Daily  . pravastatin  20 mg Oral Daily   Continuous Infusions:  PRN Meds: acetaminophen, hydrALAZINE, ondansetron (ZOFRAN) IV  Allergies:    Allergies  Allergen Reactions  . Aspirin Other (See Comments)    Dizzy   .  Other Other (See Comments)    Neurobion Forte: Skin "fell off"   Social History:   Social History   Socioeconomic History  . Marital status: Married    Spouse name: Lauris Poag  . Number of children: 3  . Years of education: 51  . Highest education level: Not on file  Occupational History  . Occupation: [employer]  Social Needs  . Financial resource strain: Not on file  . Food insecurity:    Worry: Not on file    Inability: Not on file  . Transportation needs:    Medical: Not on file    Non-medical: Not on file  Tobacco Use  . Smoking status: Former Smoker    Last attempt to quit: 09/03/2001    Years since quitting: 16.3  . Smokeless tobacco: Never Used  Substance and Sexual Activity  . Alcohol  use: Yes    Comment: 3 Bud Light beers daily,   . Drug use: No  . Sexual activity: Not on file  Lifestyle  . Physical activity:    Days per week: Not on file    Minutes per session: Not on file  . Stress: Not on file  Relationships  . Social connections:    Talks on phone: Not on file    Gets together: Not on file    Attends religious service: Not on file    Active member of club or organization: Not on file    Attends meetings of clubs or organizations: Not on file    Relationship status: Not on file  . Intimate partner violence:    Fear of current or ex partner: Not on file    Emotionally abused: Not on file    Physically abused: Not on file    Forced sexual activity: Not on file  Other Topics Concern  . 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 Diabetes Mother 31  . Stroke Father 54  . Heart disease Neg Hx    Family Status:  Family Status  Relation Name Status  . Mother  Deceased  . Father  Deceased  . Neg Hx  (Not Specified)    ROS:  Please see the history of present illness.  All other ROS reviewed and negative.     Physical Exam/Data:   Vitals:   01/04/18 0530 01/04/18 0600 01/04/18 0630 01/04/18 0742  BP: 136/78 (!) 147/89 140/88 (!) 152/99  Pulse: (!) 110 91 79 85  Resp: Temp:      TempSrc:      SpO2: 98% 100% 99% 97%  Weight:      Height:       No intake or output data in the 24 hours ending 01/04/18 1022 Filed Weights   01/04/18 0334  Weight: 200 lb (90.7 kg)   Body mass index is 36.34 kg/m.  General:  Well nourished, well developed, in no acute distress HEENT: normal Lymph: no adenopathy Neck: no JVD Endocrine:  No thryomegaly Vascular: No carotid bruits; 4/4 extremity pulses 2+, without bruits  Cardiac:  normal S1, S2; RRR; no murmur  Lungs:  clear to  auscultation bilaterally, no wheezing, rhonchi or rales  Abd: soft, nontender, no hepatomegaly  Ext: no edema Musculoskeletal:  No deformities, BUE and BLE strength normal and equal  Skin: warm and dry  Neuro:  CNs 2-12 intact, no focal abnormalities noted Psych:  Normal affect   EKG:  The EKG was personally reviewed and demonstrates:  SR, HR 86, LVH w/ Inverted T waves.  T wave changes are new from 07/05/2017 ECG Telemetry:  Telemetry was personally reviewed and demonstrates: Sinus rhythm, sinus tach  Relevant CV Studies:  ECHO: Ordered  Laboratory Data:  Chemistry Recent Labs  Lab 01/04/18 0347  NA 139  K 3.5  CL 107  CO2 23  GLUCOSE 121*  BUN 11  CREATININE 0.64  CALCIUM 8.5*  GFRNONAA >60  GFRAA >60  ANIONGAP 9    Lab Results  Component Value Date   ALT 41 12/17/2016   AST 38 12/17/2016   ALKPHOS 75 12/17/2016   BILITOT 1.1 12/17/2016   Hematology Recent Labs  Lab 01/04/18 0346  WBC 9.5  RBC 4.85  HGB 15.2  HCT 43.5  MCV 89.7  MCH 31.3  MCHC 34.9  RDW 13.0  PLT 216   Cardiac Enzymes Recent Labs  Lab 01/04/18 0534 01/04/18 0751  TROPONINI 0.05* 0.05*    Recent Labs  Lab 01/04/18 0350  TROPIPOC 0.00    BNP Recent Labs  Lab 01/04/18 0347  BNP 68.4    DDimer  Recent Labs  Lab 01/04/18 0347  DDIMER <0.27   TSH: No results found for: TSH Lipids: Lab Results  Component Value Date   CHOL 163 01/04/2018   HDL 47 01/04/2018   LDLCALC 105 (H) 01/04/2018   TRIG 53 01/04/2018   CHOLHDL 3.5 01/04/2018   HgbA1c: Lab Results  Component Value Date   HGBA1C 5.8 02/21/2017    Radiology/Studies:  Dg Chest 2 View  Result Date: 01/04/2018 CLINICAL DATA:  47 year old male with chest pain.  Cough. EXAM: CHEST - 2 VIEW COMPARISON:  Chest radiograph dated 07/05/2017 FINDINGS: The lungs are clear. There is no pleural effusion or pneumothorax. Stable borderline enlarged cardiac silhouette. No acute osseous pathology. IMPRESSION: No active  cardiopulmonary disease. Electronically Signed   By: Elgie Collard M.D.   On: 01/04/2018 05:31    Assessment and Plan:   Principal Problem: 1.  Chest pain -Symptoms are prolonged for a week or so with only a minimal elevation in his troponin - From previous cardiac catheterization 2 years ago was negative -Despite prolonged pain, troponin is only minimally elevated -However, his ECG has changes and he is very anxious about the pain. -Dr. Royann Shivers discussed the situation with the patient had a definitive answer is needed.  The risks and benefits of a cardiac CT were discussed with the patient indicates understanding and agrees to proceed. -CT was contacted and is working with the nurse to make sure parameters for the chest arm neck. -If his cardiac CT is negative for CAD, hypertension and anxiety are likely responsible for his symptoms.  Active Problems: 2.  Hypertensive urgency -We will increase his home dose of Coreg 6.25 mg up to 25 mg twice daily, first dose now.  Losartan is a good drug for him, but will wait until -We see how his renal function tolerates the CT before starting it, tomorrow -Further evaluation and management of this per IM  3.  GAD (generalized anxiety disorder) -Per IM.     For questions or updates, please contact [facility] Please consult [email or web address] for contact info under Cardiology/STEMI.   Signed, Theodore Demark, PA-C  01/04/2018 10:22 AM  I have seen and examined the patient along with Theodore Demark,  PA-C , PA NP.  I have reviewed the chart, notes and new data.  I agree with PA/NP's note.  Key new complaints: symptoms are consistent with angina, but suspect demand angina due to severe HTN in the setting of LVH/hypertensive cardiomyopathy. Anxiety due to immigration status and teenage son are big contributors. Key examination changes: obese, S4 present, otherwise normal CV exam Key new findings / data: ECG c/w LVH and secondary repol changes,  but cannot exclude ischemia  PLAN: Coronary CTA, if normal OK to DC. Increase carvedilol - adrenergic blockade will help many of his complaints, as well as for better BP control. Add ARB (after the contrast based study). He asked for anxiolytics - told him a temporary course would be helpful, but he needs to go back to his psychiatrist and ask for a new long tem solution (he was prescribed a medication, but he stopped it due to auditory hallucinations -sounded like someone was always calling his name).  Thurmon Fair, MD, [facility] [phone] 01/04/2018, 11:13 AM

## 2018-01-04 NOTE — ED Triage Notes (Signed)
Pt reports having shortness of breath with chest pain for the last 10 days. Pt also reports pain in back.

## 2018-01-04 NOTE — Progress Notes (Signed)
Discharge instructions given. Pt verbalized understanding and all questions were answered.  

## 2018-01-04 NOTE — Discharge Summary (Signed)
Physician Discharge Summary  Cameron Haney:[id number] DOB: 1970-11-10 DOA: 01/04/2018  PCP: Patient, No Pcp Per  Admit date: 01/04/2018 Discharge date: 01/04/2018  Time spent: 45 minutes  Recommendations for Outpatient Follow-up:  1. Increase Coreg to 25 mg twice daily and given a prescription for the same--he did addition of ARB as an outpatient but would not add given his issues with mild renal insufficiency--this needs to be followed closely as an outpatient 2. Will need outpatient counseling for PTSD given limited prescription of clonazepam  Discharge Diagnoses:  Principal Problem:   Chest pain Active Problems:   Hypertensive urgency   GAD (generalized anxiety disorder)   Discharge Condition: Improved  Diet recommendation: Heart healthy diabetic  Filed Weights   01/04/18 0334  Weight: 90.7 kg (200 lb)    History of present illness:  see my HPI from just this morning  Hospital Course:  Cardiology saw the patient in consult and felt he was appropriate for CT angiography of coronary arteries which turned out to be negative-the patient was admitted to the ward and had no further chest pain and his Coreg was increased as per recommendations of Dr. Salena Saner to 25 twice daily-I have cautioned him that he BUN/creatinine 22/1.2will need close follow-up as an outpatient with his regular physician as well as labs given the fact that he also is on metformin and was exposed to contrast dye otherwise however he is stable from internal medicine perspective for discharge home   Consultations:  CT chest  Discharge Exam: Vitals:   01/04/18 1407 01/04/18 1407  BP: 113/63 113/63  Pulse: 60 60  Resp:    Temp: 98.8 F (37.1 C) 98.8 F (37.1 C)  SpO2: 99% 99%    General: EOMI NCAT Cardiovascular: S1-S2 no murmur rub or gallop Respiratory: Clinically clear no added sound  Discharge Instructions   Discharge Instructions    Diet - low sodium heart healthy   Complete by:  As  directed    Discharge instructions   Complete by:  As directed    Take your medicines as directed Get labs 1 week See regular dr 1 week Good luck   Increase activity slowly   Complete by:  As directed      Allergies as of 01/04/2018      Reactions   Aspirin Other (See Comments)   Dizzy    Other Other (See Comments)   Neurobion Forte: Skin "fell off"      Medication List    STOP taking these medications   losartan 50 MG tablet Commonly known as:  COZAAR     TAKE these medications   amLODipine 5 MG tablet Commonly known as:  NORVASC Take 1 tablet (5 mg total) by mouth daily.   carvedilol 25 MG tablet Commonly known as:  COREG Take 1 tablet (25 mg total) by mouth 2 (two) times daily with a meal. What changed:    medication strength  how much to take   clonazePAM 1 MG tablet Commonly known as:  KLONOPIN Take 1 tablet (1 mg total) by mouth 2 (two) times daily. What changed:    when to take this  reasons to take this   meloxicam 7.5 MG tablet Commonly known as:  MOBIC Take 1 tablet (7.5 mg total) by mouth daily.   metFORMIN 500 MG tablet Commonly known as:  GLUCOPHAGE TAKE 1 TABLET BY MOUTH TWICE DAILY WITH A MEAL   pravastatin 20 MG tablet Commonly known as:  PRAVACHOL TAKE 1  TABLET BY MOUTH ONCE DAILY      Allergies  Allergen Reactions  . Aspirin Other (See Comments)    Dizzy   . Other Other (See Comments)    Neurobion Forte: Skin "fell off"      The results of significant diagnostics from this hospitalization (including imaging, microbiology, ancillary and laboratory) are listed below for reference.    Significant Diagnostic Studies: Dg Chest 2 View  Result Date: 01/04/2018 CLINICAL DATA:  47 year old male with chest pain.  Cough. EXAM: CHEST - 2 VIEW COMPARISON:  Chest radiograph dated 07/05/2017 FINDINGS: The lungs are clear. There is no pleural effusion or pneumothorax. Stable borderline enlarged cardiac silhouette. No acute osseous  pathology. IMPRESSION: No active cardiopulmonary disease. Electronically Signed   By: Elgie Collard M.D.   On: 01/04/2018 05:31   Ct Coronary Morph W/cta Cor W/score W/ca W/cm &/or Wo/cm  Result Date: 01/04/2018 EXAM: OVER-READ INTERPRETATION  CT CHEST The following report is an over-read performed by radiologist Dr. Richarda Overlie of [facility] on 01/04/2018. This over-read does not include interpretation of cardiac or coronary anatomy or pathology. The coronary calcium score/coronary CTA interpretation by the cardiologist is attached. COMPARISON:  Chest radiograph 01/04/2018 FINDINGS: Vascular: Normal caliber of the visualized thoracic aorta. No evidence for an aortic dissection. Limited evaluation of the pulmonary arteries due to the phase of contrast. Heart size is mildly enlarged without significant pericardial fluid. Mediastinum/Nodes: No significant lymph node enlargement in the visualized mediastinum or hilar regions. Esophagus is unremarkable. Lungs/Pleura: No large pleural effusions. No significant airspace disease or consolidation. Few peripheral densities in lingula could represent atelectasis or mild scarring. Upper Abdomen: Images of the upper abdomen are unremarkable. Musculoskeletal: Heterogeneity within a lower thoracic vertebral body most likely represents a hemangioma. Otherwise, the bone structures are unremarkable. IMPRESSION: Negative over-read examination. Electronically Signed   By: Richarda Overlie M.D.   On: 01/04/2018 14:11    Microbiology: No results found for this or any previous visit (from the past 240 hour(s)).   Labs: Basic Metabolic Panel: Recent Labs  Lab 01/04/18 0347 01/04/18 1029  NA 139  --   K 3.5  --   CL 107  --   CO2 23  --   GLUCOSE 121*  --   BUN 11  --   CREATININE 0.64 0.57*  CALCIUM 8.5*  --    Liver Function Tests: No results for input(s): AST, ALT, ALKPHOS, BILITOT, PROT, ALBUMIN in the last 168 hours. No results for input(s): LIPASE,  AMYLASE in the last 168 hours. No results for input(s): AMMONIA in the last 168 hours. CBC: Recent Labs  Lab 01/04/18 0346 01/04/18 1029  WBC 9.5 7.3  NEUTROABS 4.2  --   HGB 15.2 15.4  HCT 43.5 43.1  MCV 89.7 89.2  PLT 216 203   Cardiac Enzymes: Recent Labs  Lab 01/04/18 0534 01/04/18 0751 01/04/18 1028  TROPONINI 0.05* 0.05* 0.05*   BNP: BNP (last 3 results) Recent Labs    06/20/17 1744 01/04/18 0347  BNP 31.9 68.4    ProBNP (last 3 results) No results for input(s): PROBNP in the last 8760 hours.  CBG: No results for input(s): GLUCAP in the last 168 hours.     Signed:  Rhetta Mura MD   [facility] 01/04/2018, 4:08 PM

## 2018-01-04 NOTE — Plan of Care (Signed)
Discussed patient with Dr. Preston Fleeting.  Mr. Cameron Haney is a 47 y/o Male with pmh of HTN, HLD, and DM2; who presents with complains of shortness of breath and chest pain over the last 10 days.  On admission vital signs reveal blood pressures up to 187/126.  Work was relatively unremarkable including initial troponin, d-dimer, and BNP.  Chest x-ray appears to show no acute abnormalities.  Patient previously on coreg and losartan for blood pressure, but [facility] not [facility].  Patient was given 324 mg of aspirin and 20 mg of hydralazine.  [facility] called to admit.  accepted to a telemetry bed for observation.

## 2018-01-05 ENCOUNTER — Ambulatory Visit (HOSPITAL_COMMUNITY): Payer: MEDICAID

## 2018-01-17 ENCOUNTER — Telehealth: Payer: Self-pay | Admitting: Emergency Medicine

## 2018-01-17 NOTE — Telephone Encounter (Signed)
Pt called office initially to have appt scheduled for next month due to "not being able to breath". Pt was seen earlier this month in the hospital and states that he feels worse now. Before connecting call with triage nurse pt ended call. Triage RN attempted to call pt back regarding symptoms but no answer at this time. Left message for pt to return call to the office so that symptoms can be triaged.

## 2018-01-24 ENCOUNTER — Other Ambulatory Visit: Payer: Self-pay

## 2018-01-24 ENCOUNTER — Encounter: Payer: Self-pay | Admitting: Emergency Medicine

## 2018-01-24 ENCOUNTER — Ambulatory Visit (INDEPENDENT_AMBULATORY_CARE_PROVIDER_SITE_OTHER): Payer: Self-pay | Admitting: Emergency Medicine

## 2018-01-24 VITALS — BP 148/98 | HR 68 | Temp 98.6°F | Resp 16 | Ht 63.0 in | Wt 208.6 lb

## 2018-01-24 DIAGNOSIS — G8929 Other chronic pain: Secondary | ICD-10-CM

## 2018-01-24 DIAGNOSIS — E785 Hyperlipidemia, unspecified: Secondary | ICD-10-CM

## 2018-01-24 DIAGNOSIS — R7303 Prediabetes: Secondary | ICD-10-CM

## 2018-01-24 DIAGNOSIS — F411 Generalized anxiety disorder: Secondary | ICD-10-CM

## 2018-01-24 DIAGNOSIS — M7918 Myalgia, other site: Secondary | ICD-10-CM

## 2018-01-24 DIAGNOSIS — Z23 Encounter for immunization: Secondary | ICD-10-CM

## 2018-01-24 DIAGNOSIS — I10 Essential (primary) hypertension: Secondary | ICD-10-CM

## 2018-01-24 DIAGNOSIS — M546 Pain in thoracic spine: Secondary | ICD-10-CM

## 2018-01-24 MED ORDER — METFORMIN HCL 500 MG PO TABS: ORAL_TABLET | ORAL | 3 refills | Status: DC

## 2018-01-24 MED ORDER — CARVEDILOL 25 MG PO TABS: 25.0000 mg | ORAL_TABLET | Freq: Two times a day (BID) | ORAL | 3 refills | Status: DC

## 2018-01-24 MED ORDER — MELOXICAM 7.5 MG PO TABS: 7.5000 mg | ORAL_TABLET | Freq: Every day | ORAL | 0 refills | Status: DC | PRN

## 2018-01-24 MED ORDER — CLONAZEPAM 1 MG PO TABS: 1.0000 mg | ORAL_TABLET | Freq: Two times a day (BID) | ORAL | 0 refills | Status: DC | PRN

## 2018-01-24 MED ORDER — AMLODIPINE BESYLATE 5 MG PO TABS: 5.0000 mg | ORAL_TABLET | Freq: Every day | ORAL | 3 refills | Status: DC

## 2018-01-24 MED ORDER — PRAVASTATIN SODIUM 20 MG PO TABS: 20.0000 mg | ORAL_TABLET | Freq: Every day | ORAL | 3 refills | Status: DC

## 2018-01-24 NOTE — Patient Instructions (Addendum)
   IF you received an x-ray today, you will receive an invoice from [facility]. Please contact [facility] at [phone] with questions or concerns regarding your invoice.   IF you received labwork today, you will receive an invoice from [facility]. Please contact [facility] at [phone] with questions or concerns regarding your invoice.   Our billing staff will not be able to assist you with questions regarding bills from these companies.  You will be contacted with the lab results as soon as they are available. The fastest way to get your results is to activate your My Chart account. Instructions are located on the last page of this paperwork. If you have not heard from us regarding the results in 2 weeks, please contact this office.     Hipertensin Hypertension El trmino hipertensin es otra forma de denominar a la presin arterial elevada. La presin arterial elevada fuerza al corazn a trabajar ms para bombear la sangre. Esto puede causar problemas con el paso del tiempo. Una lectura de presin arterial est compuesta por 2 nmeros. Hay un nmero superior (sistlico) sobre un nmero inferior (diastlico). Lo ideal es tener la presin arterial por debajo de 120/80. Las decisiones saludables pueden ayudarle a disminuir su presin arterial. Es posible que necesite medicamentos que le ayuden a disminuir su presin arterial si:  Su presin arterial no disminuye mediante decisiones saludables.  Su presin arterial est por encima de 130/80.  Siga estas instrucciones en su casa: Comida y bebida  Si se lo indican, siga el plan de alimentacin de DASH (Dietary Approaches to Stop Hypertension, Maneras de alimentarse para detener la hipertensin). Esta dieta incluye: ? Que la mitad del plato de cada comida sea de frutas y verduras. ? Que un cuarto del plato de cada comida sea de cereales integrales. Los cereales integrales incluyen pasta integral, arroz integral y pan  integral. ? Comer y beber productos lcteos con bajo contenido de grasa, como leche descremada o yogur bajo en grasas. ? Que un cuarto del plato de cada comida sea de protenas bajas en grasa (magras). Las protenas bajas en grasa incluyen pescado, pollo sin piel, huevos, frijoles y tofu. ? Evitar consumir carne grasa, carne curada y procesada, o pollo con piel. ? Evitar consumir alimentos prehechos o procesados.  Consuma menos de 1500 mg de sal (sodio) por da.  Limite el consumo de alcohol a no ms de 1 medida por da si es mujer y no est embarazada y a 2 medidas por da si es hombre. Una medida equivale a 12onzas de cerveza, 5onzas de vino o 1onzas de bebidas alcohlicas de alta graduacin. Estilo de vida  Trabaje con su mdico para mantenerse en un peso saludable o para perder peso. Pregntele a su mdico cul es el peso recomendable para usted.  Realice al menos 30 minutos de ejercicio que haga que se acelere su corazn (ejercicio aerbico) la mayora de los das de la semana. Estos pueden incluir caminar, nadar o andar en bicicleta.  Realice al menos 30 minutos de ejercicio que fortalezca sus msculos (ejercicios de resistencia) al menos 3 das a la semana. Estos pueden incluir levantar pesas o hacer pilates.  No consuma ningn producto que contenga nicotina o tabaco. Esto incluye cigarrillos y cigarrillos electrnicos. Si necesita ayuda para dejar de fumar, consulte al mdico.  Controle su presin arterial en su casa tal como le indic el mdico.  Concurra a todas las visitas de control como se lo haya indicado el mdico. Esto es   importante. Medicamentos  Tome los medicamentos de venta libre y los recetados solamente como se lo haya indicado el mdico. Siga cuidadosamente las indicaciones.  No omita las dosis de medicamentos para la presin arterial. Los medicamentos pierden eficacia si omite dosis. El hecho de omitir las dosis tambin aumenta el riesgo de otros  problemas.  Pregntele a su mdico a qu efectos secundarios o reacciones a los medicamentos debe prestar atencin. Comunquese con un mdico si:  Piensa que tiene una reaccin a los medicamentos que est tomando.  Tiene dolores de cabeza frecuentes (recurrentes).  Siente mareos.  Tiene hinchazn en los tobillos.  Tiene problemas de visin. Solicite ayuda de inmediato si:  Siente un dolor de cabeza muy intenso.  Comienza a sentirse confundido.  Se siente dbil o adormecido.  Siente que va a desmayarse.  Siente un dolor muy intenso en: ? El pecho. ? El vientre (abdomen).  Devuelve (vomita) ms de una vez.  Tiene dificultad para respirar. Resumen  El trmino hipertensin es otra forma de denominar a la presin arterial elevada.  Las decisiones saludables pueden ayudarle a disminuir su presin arterial. Si no puede controlar su presin arterial mediante decisiones saludables, es posible que deba tomar medicamentos. Esta informacin no tiene como fin reemplazar el consejo del mdico. Asegrese de hacerle al mdico cualquier pregunta que tenga. Document Released: 02/03/2010 Document Revised: 07/28/2016 Document Reviewed: 07/28/2016 Elsevier Interactive Patient Education  2018 [employer]  

## 2018-01-24 NOTE — Progress Notes (Signed)
Cameron Haney 47 y.o.   Chief Complaint  Patient presents with  . Back Pain    in the ribcage x 2 weeks and shoulder blade area, per patient was IP at [facility]  . Shortness of Breath    HISTORY OF PRESENT ILLNESS: This is a 47 y.o. male here for follow-up of hypertension and ED visit on 01/04/2018.  Has a history of hypertension, prediabetes, and high cholesterol.  Also has a history of generalized anxiety.  Has seen psychiatrist in the past.  At the hospital he had extensive work-up including coronary CT that was negative.  Work-up was unremarkable.  Complains of intermittent mid back pain, usually at work, related to physical activity.  Pain at times is sharp and worse on inspiration.  Sometimes associated with difficulty breathing.  No other significant symptoms.  Presently taking amlodipine 5 mg and Coreg 25 mg twice a day for his blood pressure.  Takes metformin and pravastatin as well.  Has been on Klonopin before with some success.  HPI   Prior to Admission medications   Medication Sig Start Date End Date Taking? Authorizing Provider  amLODipine (NORVASC) 5 MG tablet Take 1 tablet (5 mg total) by mouth daily. 01/04/18  Yes Rhetta Mura, MD  carvedilol (COREG) 25 MG tablet Take 1 tablet (25 mg total) by mouth 2 (two) times daily with a meal. 01/04/18  Yes Rhetta Mura, MD  clonazePAM (KLONOPIN) 1 MG tablet Take 1 tablet (1 mg total) by mouth 2 (two) times daily. 01/04/18  Yes Rhetta Mura, MD  meloxicam (MOBIC) 7.5 MG tablet Take 1 tablet (7.5 mg total) by mouth daily. 06/21/17  Yes Shaune Pollack, MD  metFORMIN (GLUCOPHAGE) 500 MG tablet TAKE 1 TABLET BY MOUTH TWICE DAILY WITH A MEAL 12/26/17  Yes Ellese Julius, Eilleen Kempf, MD  OVER THE COUNTER MEDICATION as needed.   Yes [provider]  pravastatin (PRAVACHOL) 20 MG tablet TAKE 1 TABLET BY MOUTH ONCE DAILY 12/26/17  Yes Elleah Hemsley, Eilleen Kempf, MD    Allergies  Allergen Reactions  . Aspirin Other  (See Comments)    Dizzy   . Other Other (See Comments)    Neurobion Forte: Skin "fell off"    Patient Active Problem List   Diagnosis Date Noted  . Chest pain 01/04/2018  . CAD (coronary artery disease) 01/04/2018  . Cough due to ACE inhibitor 02/21/2017  . Pre-diabetes 11/24/2016  . Dyslipidemia 11/24/2016  . GAD (generalized anxiety disorder) 11/01/2016  . Hypertension 01/29/2016  . Hypertensive urgency 01/29/2016    Past Medical History:  Diagnosis Date  . Anxiety   . Diabetes mellitus without complication (HCC)   . High cholesterol   . Hypertension     Past Surgical History:  Procedure Laterality Date  . CARDIAC CATHETERIZATION  2016   Most likely procedure, based on his description. Not long after arriving here. They told him nothing was wrong with his heart.    Social History   Socioeconomic History  . Marital status: Married    Spouse name: Lauris Poag  . Number of children: 3  . Years of education: 46  . Highest education level: Not on file  Occupational History  . Occupation: [employer]  Social Needs  . Financial resource strain: Not on file  . Food insecurity:    Worry: Not on file    Inability: Not on file  . Transportation needs:    Medical: Not on file    Non-medical: Not on file  Tobacco  Use  . Smoking status: Former Smoker    Last attempt to quit: 09/03/2001    Years since quitting: 16.4  . Smokeless tobacco: Never Used  Substance and Sexual Activity  . Alcohol use: Yes    Comment: 3 Bud Light beers daily,   . Drug use: No  . Sexual activity: Not on file  Lifestyle  . Physical activity:    Days per week: Not on file    Minutes per session: Not on file  . Stress: Not on file  Relationships  . Social connections:    Talks on phone: Not on file    Gets together: Not on file    Attends religious service: Not on file    Active member of club or organization: Not on file    Attends meetings of clubs or organizations: Not on file     Relationship status: Not on file  . Intimate partner violence:    Fear of current or ex partner: Not on file    Emotionally abused: Not on file    Physically abused: Not on file    Forced sexual activity: Not on file  Other Topics Concern  . 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 Diabetes Mother 25  . Stroke Father 19  . Heart disease Neg Hx      Review of Systems  Constitutional: Negative.  Negative for chills, fever and weight loss.  HENT: Negative.  Negative for sore throat.   Eyes: Negative.  Negative for blurred vision and double vision.  Respiratory: Negative.  Negative for cough, hemoptysis and shortness of breath.   Cardiovascular: Negative.  Negative for chest pain, palpitations, claudication and leg swelling.  Gastrointestinal: Negative.  Negative for abdominal pain, blood in stool, diarrhea, melena, nausea and vomiting.  Genitourinary: Negative.  Negative for dysuria, frequency and hematuria.  Musculoskeletal: Positive for back pain.  Skin: Negative.  Negative for rash.  Neurological: Negative.  Negative for dizziness and headaches.  Endo/Heme/Allergies: Negative.   Psychiatric/Behavioral: The patient is nervous/anxious.   All other systems reviewed and are negative.  Vitals:   01/24/18 0804  BP: (!) 148/98  Pulse: 68  Resp: 16  Temp: 98.6 F (37 C)  SpO2: 98%     Physical Exam  Constitutional: He is oriented to person, place, and time. He appears well-developed and well-nourished.  HENT:  Head: Normocephalic and atraumatic.  Nose: Nose normal.  Mouth/Throat: Oropharynx is clear and moist.  Eyes: Pupils are equal, round, and reactive to light. Conjunctivae and EOM are normal.  Neck: Normal range of motion. Neck supple. No JVD present. No thyromegaly present.  Cardiovascular: Normal rate, regular rhythm and  normal heart sounds.  Pulmonary/Chest: Effort normal and breath sounds normal.  Abdominal: Soft. Bowel sounds are normal. He exhibits no distension. There is no tenderness.  Musculoskeletal: Normal range of motion.  Neurological: He is alert and oriented to person, place, and time. No sensory deficit. He exhibits normal muscle tone. Coordination normal.  Skin: Skin is warm and dry. Capillary refill takes less than 2 seconds.  Psychiatric: He has a normal mood and affect. His behavior is normal.  Vitals reviewed.  . Hypertension Elevated today but patient has not taken his daily morning medications.  Encouraged to continue diet and exercise.  Continue same medications.  Follow-up  in 3 to 6 months.   ASSESSMENT & PLAN: Ritchard was seen today for back pain and shortness of breath.  Diagnoses and all orders for this visit:  Essential hypertension -     amLODipine (NORVASC) 5 MG tablet; Take 1 tablet (5 mg total) by mouth daily. -     carvedilol (COREG) 25 MG tablet; Take 1 tablet (25 mg total) by mouth 2 (two) times daily with a meal.  Need for diphtheria-tetanus-pertussis (Tdap) vaccine -     Tdap vaccine greater than or equal to 7yo IM  Pre-diabetes -     metFORMIN (GLUCOPHAGE) 500 MG tablet; TAKE 1 TABLET BY MOUTH TWICE DAILY WITH A MEAL  GAD (generalized anxiety disorder) -     clonazePAM (KLONOPIN) 1 MG tablet; Take 1 tablet (1 mg total) by mouth 2 (two) times daily as needed for anxiety.  Dyslipidemia -     pravastatin (PRAVACHOL) 20 MG tablet; Take 1 tablet (20 mg total) by mouth daily.  Chronic bilateral thoracic back pain  Musculoskeletal pain -     meloxicam (MOBIC) 7.5 MG tablet; Take 1 tablet (7.5 mg total) by mouth daily as needed for pain.    Patient Instructions       IF you received an x-ray today, you will receive an invoice from [facility]. Please contact [facility] at [phone] with questions or concerns regarding your invoice.     IF you received labwork today, you will receive an invoice from [facility]. Please contact [facility] at [phone] with questions or concerns regarding your invoice.   Our billing staff will not be able to assist you with questions regarding bills from these companies.  You will be contacted with the lab results as soon as they are available. The fastest way to get your results is to activate your My Chart account. Instructions are located on the last page of this paperwork. If you have not heard from [place] regarding the results in 2 weeks, please contact this office.     Hipertensin Hypertension El trmino hipertensin es otra forma de denominar a la presin arterial elevada. La presin arterial elevada fuerza al corazn a trabajar ms para bombear la sangre. Esto puede causar problemas con el paso del [place]. Una lectura de presin arterial est compuesta por 2 nmeros. Hay un nmero superior (sistlico) sobre un nmero inferior (diastlico). Lo ideal es tener la presin arterial por debajo de 120/80. Las decisiones saludables pueden ayudarle a disminuir su presin arterial. Es posible que necesite medicamentos que le ayuden a disminuir su presin arterial si:  Su presin arterial no disminuye mediante decisiones saludables.  Su presin arterial est por encima de 130/80.  Siga estas instrucciones en su casa: Comida y bebida  Si se lo indican, siga el plan de alimentacin de DASH (Dietary Approaches to Stop Hypertension, Maneras de alimentarse para detener la hipertensin). Esta dieta incluye: ? Que la mitad del plato de cada comida sea de frutas y verduras. ? Que un cuarto del plato de cada comida sea de cereales integrales. Los cereales integrales incluyen pasta integral, arroz integral y pan integral. ? Comer y beber productos lcteos con bajo contenido de [place], como leche descremada o yogur bajo en grasas. ? Que un cuarto del plato de cada comida sea de protenas bajas en grasa  (magras). Las protenas bajas en grasa incluyen pescado, pollo sin piel, huevos, frijoles y tofu. ? Evitar consumir carne grasa, carne curada y procesada, o pollo con piel. ? Evitar consumir alimentos  prehechos o procesados.  Consuma menos de 1500 mg de sal (sodio) por da.  Limite el consumo de alcohol a no ms de 1 medida por da si es mujer y no est Orthoptist y a 2 medidas por da si es hombre. Una medida equivale a 12onzas de cerveza, 5onzas de vino o 1onzas de bebidas alcohlicas de alta graduacin. Estilo de vida  Trabaje con su mdico para mantenerse en un peso saludable o para perder peso. Pregntele a su mdico cul es el peso recomendable para usted.  Realice al menos 30 minutos de ejercicio que haga que se acelere su corazn (ejercicio Magazine features editor) la [employer] de la [place]. Estos pueden incluir caminar, nadar o andar en bicicleta.  Realice al menos 30 minutos de ejercicio que fortalezca sus msculos (ejercicios de resistencia) al menos 3 das a la [place]. Estos pueden incluir levantar pesas o hacer pilates.  No consuma ningn producto que contenga nicotina o tabaco. Esto incluye cigarrillos y cigarrillos electrnicos. Si necesita ayuda para dejar de fumar, consulte al [employer].  Controle su presin arterial en su casa tal como le indic el mdico.  Concurra a todas las visitas de control como se lo haya indicado el mdico. Esto es importante. Medicamentos  [employer] de venta libre y los recetados solamente como se lo haya indicado el mdico. Siga cuidadosamente las indicaciones.  No omita las dosis de medicamentos para la presin arterial. Los medicamentos pierden eficacia si omite dosis. El hecho de omitir las dosis tambin [place] el riesgo de otros problemas.  Pregntele a su mdico a qu efectos secundarios o reacciones a los Museum/gallery curator. Comunquese con un mdico si:  Piensa que tiene [place] reaccin a los medicamentos que est  tomando.  Tiene dolores de cabeza frecuentes (recurrentes).  Siente mareos.  Tiene hinchazn en los tobillos.  Tiene problemas de visin. Solicite ayuda de inmediato si:  Siente un dolor de cabeza muy intenso.  Comienza a sentirse confundido.  Se siente dbil o adormecido.  Siente que va a desmayarse.  Siente un dolor muy intenso en: ? El pecho. ? El vientre (abdomen).  Devuelve (vomita) ms de una vez.  Tiene dificultad para respirar. Resumen  El trmino hipertensin es otra forma de denominar a la presin arterial elevada.  Las decisiones saludables pueden ayudarle a disminuir su presin arterial. Si no puede controlar su presin arterial mediante decisiones saludables, es posible que deba tomar medicamentos. Esta informacin no tiene Theme park manager el consejo del mdico. Asegrese de hacerle al mdico cualquier pregunta que tenga. Document Released: 02/03/2010 Document Revised: 07/28/2016 Document Reviewed: 07/28/2016 Elsevier Interactive Patient Education  2018 Elsevier Inc.      Edwina Barth, MD [facility]

## 2018-01-24 NOTE — Assessment & Plan Note (Signed)
Elevated today but patient has not taken his daily morning medications.  Encouraged to continue diet and exercise.  Continue same medications.  Follow-up in 3 to 6 months.

## 2018-08-22 ENCOUNTER — Other Ambulatory Visit: Payer: Self-pay

## 2018-08-22 ENCOUNTER — Emergency Department (HOSPITAL_COMMUNITY): Payer: Self-pay

## 2018-08-22 ENCOUNTER — Emergency Department (HOSPITAL_COMMUNITY)
Admission: EM | Admit: 2018-08-22 | Discharge: 2018-08-22 | Disposition: A | Discharge status: Home / Self Care | Payer: Self-pay | Attending: Emergency Medicine | Admitting: Emergency Medicine

## 2018-08-22 ENCOUNTER — Encounter (HOSPITAL_COMMUNITY): Payer: Self-pay | Admitting: Emergency Medicine

## 2018-08-22 DIAGNOSIS — Z79899 Other long term (current) drug therapy: Secondary | ICD-10-CM | POA: Insufficient documentation

## 2018-08-22 DIAGNOSIS — Z87891 Personal history of nicotine dependence: Secondary | ICD-10-CM | POA: Insufficient documentation

## 2018-08-22 DIAGNOSIS — E119 Type 2 diabetes mellitus without complications: Secondary | ICD-10-CM | POA: Insufficient documentation

## 2018-08-22 DIAGNOSIS — I10 Essential (primary) hypertension: Secondary | ICD-10-CM | POA: Insufficient documentation

## 2018-08-22 DIAGNOSIS — I2510 Atherosclerotic heart disease of native coronary artery without angina pectoris: Secondary | ICD-10-CM | POA: Insufficient documentation

## 2018-08-22 DIAGNOSIS — R079 Chest pain, unspecified: Secondary | ICD-10-CM | POA: Insufficient documentation

## 2018-08-22 DIAGNOSIS — Z7984 Long term (current) use of oral hypoglycemic drugs: Secondary | ICD-10-CM | POA: Insufficient documentation

## 2018-08-22 LAB — CBC
HCT: 44.3 % (ref 39.0–52.0)
HEMOGLOBIN: 15.3 g/dL (ref 13.0–17.0)
MCH: 32.1 pg (ref 26.0–34.0)
MCHC: 34.5 g/dL (ref 30.0–36.0)
MCV: 92.9 fL (ref 80.0–100.0)
PLATELETS: 212 10*3/uL (ref 150–400)
RBC: 4.77 MIL/uL (ref 4.22–5.81)
RDW: 12.6 % (ref 11.5–15.5)
WBC: 8.1 10*3/uL (ref 4.0–10.5)
nRBC: 0 % (ref 0.0–0.2)

## 2018-08-22 LAB — POCT I-STAT TROPONIN I
TROPONIN I, POC: 0.01 ng/mL (ref 0.00–0.08)
Troponin i, poc: 0 ng/mL (ref 0.00–0.08)

## 2018-08-22 LAB — BASIC METABOLIC PANEL
ANION GAP: 9 (ref 5–15)
BUN: 9 mg/dL (ref 6–20)
CO2: 24 mmol/L (ref 22–32)
Calcium: 8.4 mg/dL — ABNORMAL LOW (ref 8.9–10.3)
Chloride: 105 mmol/L (ref 98–111)
Creatinine, Ser: 0.59 mg/dL — ABNORMAL LOW (ref 0.61–1.24)
GFR calc Af Amer: >60 mL/min (ref >60)
Glucose, Bld: 112 mg/dL — ABNORMAL HIGH (ref 70–99)
POTASSIUM: 3.4 mmol/L — AB (ref 3.5–5.1)
SODIUM: 138 mmol/L (ref 135–145)

## 2018-08-22 LAB — GLUCOSE, CAPILLARY
Glucose-Capillary: 112 mg/dL — ABNORMAL HIGH (ref 70–99)
Glucose-Capillary: 102 mg/dL — ABNORMAL HIGH (ref 70–99)

## 2018-08-22 MED ORDER — CLONAZEPAM 0.5 MG PO TABS
1.0000 mg | ORAL_TABLET | Freq: Once | ORAL | Status: AC
  Administered 2018-08-22: 1 mg via ORAL
  Filled 2018-08-22: qty 2

## 2018-08-22 NOTE — ED Provider Notes (Signed)
[facility]-EMERGENCY DEPT Provider Note   CSN: [id number] Arrival date & time: 08/22/18  4098     History   Chief Complaint Chief Complaint  Patient presents with  . chest pain  . Palpitations    HPI [address] Cameron Haney is a 47 y.o. male.  47 y.o male with a PMH HTN,DM, CAD presents with a chief complaint chest pain x yesterday.Patient reports he was on his way to work yesterday when he experience a sudden chest pressure in the middle of his chest with radiation to the left. He then proceeded to drive to the gas station and picked up 2 beers and drank them.He reports this helped him burp. He then reports driving home and picking up four more beers to relieve the "gas in his chest". This morning he reports waking up with palpitations along chest pressure and shortness of breath. He reports going to work this morning and feeling confused and in a daze reports this is how he feels when "my pressure is elevated or my sugar is high". He reports taking his blood pressure medication this morning along with metformin. However,patient reports he usually doesn't take his medication daily as they make him feel poorly. He denies any fever, nausea, vomiting, fever or other complaints.  Also reports he has been out of his Klonopin causing him to be nervous and anxious about his medical problems.     Past Medical History:  Diagnosis Date  . Anxiety   . Diabetes mellitus without complication (HCC)   . High cholesterol   . Hypertension     Patient Active Problem List   Diagnosis Date Noted  . CAD (coronary artery disease) 01/04/2018  . Pre-diabetes 11/24/2016  . Dyslipidemia 11/24/2016  . GAD (generalized anxiety disorder) 11/01/2016  . Hypertension 01/29/2016  . Hypertensive urgency 01/29/2016    Past Surgical History:  Procedure Laterality Date  . CARDIAC CATHETERIZATION  2016   Most likely procedure, based on his description. Not long after arriving here.  They told him nothing was wrong with his heart.        Home Medications    Prior to Admission medications   Medication Sig Start Date End Date Taking? Authorizing Provider  amLODipine (NORVASC) 5 MG tablet Take 1 tablet (5 mg total) by mouth daily. 01/24/18 08/22/18 Yes Sagardia, Eilleen Kempf, MD  carvedilol (COREG) 25 MG tablet Take 1 tablet (25 mg total) by mouth 2 (two) times daily with a meal. 01/24/18 08/22/18 Yes Sagardia, Eilleen Kempf, MD  clonazePAM (KLONOPIN) 1 MG tablet Take 1 tablet (1 mg total) by mouth 2 (two) times daily as needed for anxiety. 01/24/18  Yes Sagardia, Eilleen Kempf, MD  metFORMIN (GLUCOPHAGE) 500 MG tablet TAKE 1 TABLET BY MOUTH TWICE DAILY WITH A MEAL 01/24/18  Yes Sagardia, Eilleen Kempf, MD  pravastatin (PRAVACHOL) 20 MG tablet Take 1 tablet (20 mg total) by mouth daily. 01/24/18  Yes Sagardia, Eilleen Kempf, MD  meloxicam (MOBIC) 7.5 MG tablet Take 1 tablet (7.5 mg total) by mouth daily as needed for pain. Patient not taking: Reported on 08/22/2018 01/24/18   Georgina Quint, MD    Family History Family History  Problem Relation Age of Onset  . Diabetes Mother 75  . Stroke Father 79  . Heart disease Neg Hx     Social History Social History   Tobacco Use  . Smoking status: Former Smoker    Last attempt to quit: 09/03/2001    Years since quitting: 16.9  .  Smokeless tobacco: Never Used  Substance Use Topics  . Alcohol use: Yes    Comment: 3 Bud Light beers daily,   . Drug use: No     Allergies   Aspirin and Other   Review of Systems Review of Systems  Constitutional: Negative for chills and fever.  HENT: Negative for ear pain and sore throat.   Eyes: Negative for pain and visual disturbance.  Respiratory: Positive for shortness of breath. Negative for cough.   Cardiovascular: Positive for chest pain and palpitations.  Gastrointestinal: Negative for abdominal pain and vomiting.  Genitourinary: Negative for dysuria and hematuria.    Musculoskeletal: Negative for arthralgias and back pain.  Skin: Negative for color change and rash.  Neurological: Negative for seizures and syncope.  Psychiatric/Behavioral: The patient is nervous/anxious.   All other systems reviewed and are negative.    Physical Exam Updated Vital Signs BP (!) 187/108   Pulse 73   Temp 98.4 F (36.9 C) (Oral)   Resp (!) 24   SpO2 100%   Physical Exam Vitals signs and nursing note reviewed.  Constitutional:      Appearance: He is well-developed. He is obese. He is not ill-appearing or diaphoretic.  HENT:     Head: Normocephalic and atraumatic.  Eyes:     General: No scleral icterus.    Pupils: Pupils are equal, round, and reactive to light.  Neck:     Musculoskeletal: Normal range of motion.  Cardiovascular:     Heart sounds: Normal heart sounds.  Pulmonary:     Effort: Pulmonary effort is normal.     Breath sounds: Normal breath sounds. No wheezing.  Chest:     Chest wall: No tenderness.  Abdominal:     General: Bowel sounds are normal. There is no distension.     Palpations: Abdomen is soft.     Tenderness: There is no abdominal tenderness.  Musculoskeletal:        General: No tenderness or deformity.  Skin:    General: Skin is warm and dry.  Neurological:     Mental Status: He is alert and oriented to person, place, and time.      ED Treatments / Results  Labs (all labs ordered are listed, but only abnormal results are displayed) Labs Reviewed  BASIC METABOLIC PANEL - Abnormal; Notable for the following components:      Result Value   Potassium 3.4 (*)    Glucose, Bld 112 (*)    Creatinine, Ser 0.59 (*)    Calcium 8.4 (*)    All other components within normal limits  GLUCOSE, CAPILLARY - Abnormal; Notable for the following components:   Glucose-Capillary 112 (*)    All other components within normal limits  GLUCOSE, CAPILLARY - Abnormal; Notable for the following components:   Glucose-Capillary 102 (*)    All  other components within normal limits  CBC  I-STAT TROPONIN, ED  CBG MONITORING, ED  POCT I-STAT TROPONIN I  I-STAT TROPONIN, ED  POCT I-STAT TROPONIN I    EKG EKG Interpretation  Date/Time:  Tuesday August 22 2018 07:52:28 EST Ventricular Rate:  70 PR Interval:    QRS Duration: 85 QT Interval:  398 QTC Calculation: 430 R Axis:   31 Text Interpretation:  Sinus rhythm LVH, early repol unchanged from prior EKG Confirmed by Virgina NorfolkAdam, Curatolo [phone]([id number]) on 08/22/2018 7:56:53 AM   Radiology Dg Chest 2 View  Result Date: 08/22/2018 CLINICAL DATA:  Central chest tightness and shortness of breath. EXAM:  CHEST - 2 VIEW COMPARISON:  01/04/2018. FINDINGS: Stable borderline enlarged cardiac silhouette, clear lungs and normal vascularity. Thoracic spine degenerative changes. IMPRESSION: No acute abnormality. Electronically Signed   By: Beckie SaltsSteven  Reid M.D.   On: 08/22/2018 08:26    Procedures Procedures (including critical care time)  Medications Ordered in ED Medications  clonazePAM (KLONOPIN) tablet 1 mg (1 mg Oral Given 08/22/18 [id number])     Initial Impression / Assessment and Plan / ED Course  I have reviewed the triage vital signs and the nursing notes.  Pertinent labs & imaging results that were available during my care of the patient were reviewed by me and considered in my medical decision making (see chart for details).    Presents with 2 episodes of chest pain that began yesterday along with shortness of breath. He reports non-compliance with medication. He tried taking all his medication today for relieve in symptoms. Patient reports he is under a lot of stress lately as his wife recently had surgery and he is more nervous episodes than usual.Patient's EKG was sent with his previous EKG, first troponin was negative.  We will place a call for cardiology as patient had a CTA angiography 6 months ago and CAD was noted. 1:04 PM Spoke to Cardiology on call who recommended due to patient's  negative CTA angiography from 2017 patient does not suffer from CAD and is appropriate for outpatient follow up.   Second troponin obtained which was also -0.00 EKG has no changes from his previous.  Patient is consistently hypertensive however he is noncompliant with medication.  He is requesting a refill on his Klonopin which I have now explained to him that this is not an acute complaint that he will need to follow-up with his primary care physician who refills this medication.  Patient is advised to return if his symptoms worsen or he experiences any shortness of breath, chest pain.No translator was used during this visit, return precautions provided in spanish, he voices understanding.   Final Clinical Impressions(s) / ED Diagnoses   Final diagnoses:  Chest pain, unspecified type    ED Discharge Orders    None       Claude MangesSoto, Tashima Scarpulla, PA-C 08/22/18 1442    Virgina NorfolkCuratolo, Adam, DO 08/22/18 1841

## 2018-08-22 NOTE — Discharge Instructions (Addendum)
Todos sus resultados estuvieron normales hoy. Cameron SatoHaga una cita con su doctor de cabezera para manejar su presion alta. Por favor tome su medicina todos los dias.

## 2018-08-22 NOTE — ED Triage Notes (Signed)
Pt reports yesterday had SOB. Today pt c/o central chest tightness and heart pounding. Pt reports took Amlodipine 5mg , Carvedilol 25mg  and Metformin this morning.

## 2018-08-25 ENCOUNTER — Other Ambulatory Visit: Payer: Self-pay | Admitting: Emergency Medicine

## 2018-08-25 ENCOUNTER — Ambulatory Visit: Payer: Self-pay

## 2018-08-25 DIAGNOSIS — F411 Generalized anxiety disorder: Secondary | ICD-10-CM

## 2018-08-25 NOTE — Telephone Encounter (Signed)
Pt called from pharmacy to request refill of Klonopin.    Advised that message will be sent to provider but response not necessarily expected today as provider not in clinic.  Pt states his systolic BP is in 170s.  Advised that provider may request appt prior to sending Rx for Klonopin. Also advised that based on BP and triage earlier today, recommend pt go to Emergency Dept.  Pt states he does not feel safe to go without medication through weekend.  Advised again that based on time of day and unable to guarantee his message will be reviewed prior to appt.  Pt states he will go to Emergency Department.

## 2018-08-25 NOTE — Telephone Encounter (Signed)
Pt called with interpreter # [id number]. Pt states that he has been in the hospital on 08/22/18. Pt states that he feels anxious dizzy and nausea. He has a slight headache. He has no weakness or numbness.  He states the front of his head feels heavy.  He states that he had CP on the 24th before going to the ED but does not have it now.  Pt states he is confused but is able to give detail about his hospital visit.  He state that he is worried that his BP is high but can not check it.  He states the doctor in the ED gave him a medication but only one because she states it was a controled medication.  Pt states he feels anxious because he needs that medication and has to wait for appointment. Pt named medication as clonazepam. Pt states he feels scared but does not know what he is scared of. He states his vision is blurry. He states that he has a lot of stress with his job. He has problems sitting still, and concentrating. Per protocol pt was told that he should return to the ED for evaluation of his symptoms.  Pt refused stating that all they do is take his blood and do nothing for him. Pt denies suicidal/ homicidal thoughts. Again because of his anxiety and neuro symptoms pt was urged to go to the ED but refused. Pt was given the S/S of stroke and told that if he experienced any worsening of his symptoms that he must call 911. Pt agrees and we reviewed his aoffice appointment that is scheduled for Monday. Pt was urged to call his pharmacy for refill of his Clonazepam. Care advice read to patient. Pt verbalized understanding of all instructions. Reason for Disposition . Headache  (and neurologic deficit) . [1] Lightheadedness or dizziness AND [2] persists > 10 minutes AND [3] not relieved by reassurance provided by triager  Answer Assessment - Initial Assessment Questions 1. SYMPTOM: "What is the main symptom you are concerned about?" (e.g., weakness, numbness)     No dizzy only 2. ONSET: "When did this start?"  (minutes, hours, days; while sleeping)     Since at the hospital 3. LAST NORMAL: "When was the last time you were normal (no symptoms)?"     dec24 4. PATTERN "Does this come and go, or has it been constant since it started?"  "Is it present now?"     yes 5. CARDIAC SYMPTOMS: "Have you had any of the following symptoms: chest pain, difficulty breathing, palpitations?"     No not now 6. NEUROLOGIC SYMPTOMS: "Have you had any of the following symptoms: headache, dizziness, vision loss, double vision, changes in speech, unsteady on your feet?"   Dizzy, nausea,confused, blurry vision, headache 7. OTHER SYMPTOMS: "Do you have any other symptoms?"     Nervousness scarred but unsure of what he is scared of 8. PREGNANCY: "Is there any chance you are pregnant?" "When was your last menstrual period?"    N/A  Answer Assessment - Initial Assessment Questions 1. CONCERN: "What happened that made you call today?"     BP goes high I get nervous 2. ANXIETY SYMPTOM SCREENING: "Can you describe how you have been feeling?"  (e.g., tense, restless, panicky, anxious, keyed up, trouble sleeping, trouble concentrating)     Anxious  concentrating trouble sitting 3. ONSET: "How long have you been feeling this way?"    Since hospital 4. RECURRENT: "Have you felt this way before?"  If yes: "What happened that time?" "What helped these feelings go away in the past?"      yes 5. RISK OF HARM - SUICIDAL IDEATION:  "Do you ever have thoughts of hurting or killing yourself?"  (e.g., yes, no, no but preoccupation with thoughts about death)   - INTENT:  "Do you have thoughts of hurting or killing yourself right NOW?" (e.g., yes, no, N/A)   - PLAN: "Do you have a specific plan for how you would do this?" (e.g., gun, knife, overdose, no plan, N/A)    no 6. RISK OF HARM - HOMICIDAL IDEATION:  "Do you ever have thoughts of hurting or killing someone else?"  (e.g., yes, no, no but preoccupation with thoughts about death)   -  INTENT:  "Do you have thoughts of hurting or killing someone right NOW?" (e.g., yes, no, N/A)   - PLAN: "Do you have a specific plan for how you would do this?" (e.g., gun, knife, no plan, N/A)      No 7. FUNCTIONAL IMPAIRMENT: "How have things been going for you overall in your life? Have you had any more difficulties than usual doing your normal daily activities?"  (e.g., better, same, worse; self-care, school, work, interactions)     No  8. SUPPORT: "Who is with you now?" "Who do you live with?" "Do you have family or friends nearby who you can talk to?"      wife 9. THERAPIST: "Do you have a counselor or therapist? Name?"   4years ago 10. STRESSORS: "Has there been any new stress or recent changes in your life?"       Yes work gives thing he is not to do, and family is stressful 6111. CAFFEINE ABUSE: "Do you drink caffeinated beverages, and how much each day?" (e.g., coffee, tea, colas)       Coke 1 12. SUBSTANCE ABUSE: "Do you use any illegal drugs or alcohol?"       no 13. OTHER SYMPTOMS: "Do you have any other physical symptoms right now?" (e.g., chest pain, palpitations, difficulty breathing, fever)      Buzzy in ear 14. PREGNANCY: "Is there any chance you are pregnant?" "When was your last menstrual period?"       N/A  Protocols used: NEUROLOGIC DEFICIT-A-AH, ANXIETY AND PANIC ATTACK-A-AH

## 2018-08-28 ENCOUNTER — Ambulatory Visit (INDEPENDENT_AMBULATORY_CARE_PROVIDER_SITE_OTHER): Payer: Self-pay | Admitting: Emergency Medicine

## 2018-08-28 ENCOUNTER — Encounter: Payer: Self-pay | Admitting: Emergency Medicine

## 2018-08-28 ENCOUNTER — Other Ambulatory Visit: Payer: Self-pay

## 2018-08-28 VITALS — BP 151/79 | HR 68 | Temp 98.6°F | Resp 16 | Ht 63.0 in | Wt 214.2 lb

## 2018-08-28 DIAGNOSIS — F418 Other specified anxiety disorders: Secondary | ICD-10-CM | POA: Insufficient documentation

## 2018-08-28 DIAGNOSIS — F411 Generalized anxiety disorder: Secondary | ICD-10-CM

## 2018-08-28 DIAGNOSIS — I10 Essential (primary) hypertension: Secondary | ICD-10-CM

## 2018-08-28 MED ORDER — PAROXETINE HCL 20 MG PO TABS: 20.0000 mg | ORAL_TABLET | Freq: Every day | ORAL | 1 refills | Status: AC

## 2018-08-28 NOTE — Telephone Encounter (Signed)
FYI

## 2018-08-28 NOTE — Patient Instructions (Addendum)
If you have lab work done today you will be contacted with your lab results within the next 2 weeks.  If you have not heard from us then please contact us. The fastest way to get your results is to register for My Chart.   IF you received an x-ray today, you will [facility] an [facility]. Please contact [facility] at [phone] with questions or concerns regarding your invoice.   IF you received labwork today, you will receive an invoice from [facility]. Please contact [facility] at [phone] with questions or concerns regarding your invoice.   Our billing staff will not be able to assist you with questions regarding bills from these companies.  You will be contacted with the lab results as soon as they are available. The fastest way to get your results is to activate your My Chart account. Instructions are located on the last page of this paperwork. If you have not heard from us regarding the results in 2 weeks, please contact this office.     Transtorno de ansiedade generalizada, adultos Generalized Anxiety Disorder, Adult O transtorno de ansiedade generalizada (TAG)  um distrbio de sade mental. As pessoas que sofrem dessa condio ficam constantemente ansiosas com eventos do dia a dia. Diferentemente da ansiedade normal, a ansiedade relacionada ao TAG no  disparada por um evento especfico. Alm disso, essa ansiedade no diminui ou melhora com o tempo. O TAG interfere nas funes bsicas da vida, incluindo relacionamentos, trabalho e escola. O TAG pode variar de leve a grave. Pessoas com TAG grave podem ter episdios intensos de ansiedade com sintomas fsicos (ataques de pnico). Quais so as causas? A causa exata do TAG  desconhecida. O que aumenta o risco? Esse quadro clnico tem maior probabilidade de se desenvolver em:  Mulheres.  Pessoas com histrico familiar de transtornos de ansiedade.  Pessoas muito tmidas.  Pessoas que passam por  eventos muito estressantes na vida, como a morte de Lucent Technologiespessoas amadas.  Pessoas que tm um First Data Corporationambiente familiar muito estressante. Quais so os sinais ou sintomas? Pessoas com TAG geralmente se preocupam em excesso com muitas coisas em suas vidas, como sua sade e sua famlia. Elas tambm podem ser excessivamente preocupadas com:  Serem boas no trabalho.  Serem pontuais.  Desastres naturais.  Amizades. Os sintomas fsicos do TAG incluem:  Fadiga.  Tenso muscular ou espasmos musculares.  Tremedeira ou sensao de fraqueza.  Tomar sustos com facilidade.  Sentir seu corao bater forte ou acelerado.  Sentir falta de ar ou como se no conseguisse respirar fundo.  Dificuldade para pegar no sono ou manter o sono profundo.  Sudorese.  Enjoo, diarreia ou sndrome do intestino irritvel.  Dores de cabea.  Dificuldade de Cubaconcentrao ou de Clipper Millsmemria.  Agitao.  Irritabilidade. Como esse quadro clnico  diagnosticado? Seu mdico poder diagnosticar o TAG com base nos seus sintomas e histrico mdico. Voc tambm passar por um exame fsico. O mdico far perguntas especficas sobre seus sintomas, incluindo qual a intensidade deles, quando comearam e se surgem e passam. Seu mdico poder perguntar sobre seu uso de lcool ou drogas, incluindo medicamentos vendidos com receita. Seu mdico poder encaminh-lo a um profissional de sade mental para avaliao e tratamento adicionais. O seu mdico far um exame minucioso e poder realizar testes adicionais para descartar outras causas possveis de seus sintomas. Para ser diagnosticado com TAG, uma pessoa precisa sofrer de uma ansiedade que:  MaliEsteja fora de controle dele ou dela.  Afete diversos aspectos diferentes  de sua vida, como trabalho e relacionamentos.  Cause um nvel de angstia que o torne incapaz de participar de atividades normais.  Inclua pelo menos trs sintomas fsicos do TAG, como inquietao, fadiga, dificuldade de  concentrao, irritabilidade, tenso muscular ou problemas para dormir. Antes que seu mdico possa confirmar um diagnstico de TAG, esses sintomas devem estar presentes na [place] Dianemaioria dos dias e devem durar seis meses ou Astoriamais. Como esse quadro clnico  tratado? As seguintes terapias so geralmente usadas para tratar o TAG:  Medicamento. Medicamentos antidepressivos em geral so prescritos para o controle dirio de Special educational needs teacherlongo prazo. Medicamentos para ansiedade podem ser [address] em casos graves, especialmente quando ocorrem ataques de pnico.  Terapia pela fala (psicoterapia). Certos tipos de terapia pela fala podem ser teis no tratamento do TAG, ao proporcionarem apoio, informaes e orientao. As opes incluem: ? Terapia cognitivo-comportamental (TCC). As pessoas aprendem habilidades e tcnicas para enfrentar e aliviar a ansiedade. Aprendem a identificar pensamentos e comportamentos irreais ou negativos e a substitu-los por conceitos positivos. ? Terapia de aceitao e compromisso (TAC). Esse tratamento ensina s pessoas como a conscincia pode ser uma forma de lidar com pensamentos e sentimentos indesejados. ? Biofeedback. Esse processo  um treinamento para que voc Therapist, artconsiga controlar as Technical brewerrespostas de seu corpo (respostas fisiolgicas) por meio de tcnicas de respirao e mtodos de relaxamento. Voc trabalhar com um terapeuta enquanto mquinas so usadas para monitorar seus sintomas fsicos.  Tcnicas de controle do estresse. Incluem ioga, meditao e exerccio. Um especialista em sade mental poder determinar qual tratamento  melhor para voc. Algumas pessoas obtm melhoras com um nico tratamento. No entanto, outras pessoas necessitam de uma combinao de tratamentos. Siga estas instrues em casa:  Tome medicamentos vendidos com ou sem receita mdica somente de acordo com as indicaes do seu mdico.  Tente manter uma rotina normal.  Tente prever as situaes estressantes e dedique um tempo  maior para contorn-las.  Pratique todas as tcnicas de controle do estresse ou autocalmantes conforme ensinado por seu mdico.  No culpe a si mesmo por recadas ou por Acupuncturistconseguir melhorar.  Tente reconhecer seus avanos, mesmo que sejam pequenos.  Comparea a todas as consultas de acompanhamento de acordo com as orientaes do seu mdico. Isso  importante. Entre em contato com um mdico se:  Seus sintomas no melhorarem.  Seus sintomas piorarem.  Voc tiver sinais de depresso, como: ? Um humor persistentemente triste, abatido ou irritvel. ? Perda de prazer em atividades que sempre foram prazerosas. ? Alterao no peso ou no apetite. ? Alterao no sono. ? Evitar os amigos ou familiares. ? Falta de energia em tarefas normais. ? Sensao de culpa ou falta de valor. Tomasa Hosebtenha ajuda imediatamente se:  Considerar seriamente a ideia de fazer mal a si mesmo ou a outras pessoas. Se sentir vontade de ferir a si mesmo ou a terceiros ou pensar em tirar a prpria vida, procure ajuda imediatamente. Voc pode ir para o pronto-socorro mais prximo ou ligar para:  O nmero de Technical breweremergncia local (911, nos [place]).  Um servio telefnico de preveno do suicdio, como o National Suicide Prevention Lifeline, no nmero [phone]. Ele funciona 24 horas por dia. Resumo  O transtorno da ansiedade generalizada (TAG)  um distrbio de sade mental que envolve um tipo de ansiedade que no  causado por um evento especfico.  Pessoas com TAG geralmente se preocupam em excesso com muitas coisas em Bank of New York Companysuas vidas, como sua sade e sua famlia.  O TAG pode causar sintomas fsicos como inquietao,  dificuldade de concentrao, problemas para dormir, transpirao frequente, enjoo, diarreia, dor de cabea e tremedeira ou espasmos musculares.  Um especialista em sade mental poder determinar qual tratamento  melhor para voc. Algumas pessoas obtm melhoras com um nico tratamento. No entanto, outras  pessoas necessitam de uma combinao de tratamentos. Estas informaes no se destinam a substituir as recomendaes de seu mdico. No deixe de discutir quaisquer dvidas com seu mdico. Document Released: 09/12/2015 Document Revised: 12/16/2016 Document Reviewed: 12/16/2016 Elsevier Interactive Patient Education  2019 [employer].

## 2018-08-28 NOTE — Progress Notes (Signed)
Pt called with interpreter # [id number]. Pt states that he has been in the hospital on 08/22/18. Pt states that he feels anxious dizzy and nausea. He has a slight headache. He has no weakness or numbness.  He states the front of his head feels heavy.  He states that he had CP on the 24th before going to the ED but does not have it now.  Pt states he is confused but is able to give detail about his hospital visit.  He state that he is worried that his BP is high but can not check it.  He states the doctor in the ED gave him a medication but only one because she states it was a controled medication.  Pt states he feels anxious because he needs that medication and has to wait for appointment. Pt named medication as clonazepam. Pt states he feels scared but does not know what he is scared of. He states his vision is blurry. He states that he has a lot of stress with his job. He has problems sitting still, and concentrating. Per protocol pt was told that he should return to the ED for evaluation of his symptoms.  Pt refused stating that all they do is take his blood and do nothing for him. Pt denies suicidal/ homicidal thoughts. Again because of his anxiety and neuro symptoms pt was urged to go to the ED but refused. Pt was given the S/S of stroke and told that if he experienced any worsening of his symptoms that he must call 911. Pt agrees and we reviewed his aoffice appointment that is scheduled for Monday. Pt was urged to call his pharmacy for refill of his Clonazepam. Care advice read to patient. Pt verbalized understanding of all instructions.  Cameron Haney 47 y.o.   Chief Complaint  Patient presents with  . Dizziness    per patient with confusion started 08/22/2018  . Blurred Vision    started 08/22/2018    HISTORY OF PRESENT ILLNESS: This is a 47 y.o. male with history of generalized anxiety disorder exacerbated by situational acute problems.  In the past he has seen a psychiatrist in [place]  and has also seen a psychiatrist here.  Diagnostic imaging done in [place] was within normal limits.  Has been tried on different medications with partial results.  States paroxetine 20 mg has worked well in the past.  Also takes clonazepam as needed with good results.  Recently seen in the emergency room on 08/22/2018 with atypical chest pain and high blood pressure most likely triggered by anxiety attack.  Feels much better today.  Recent stressors include wife's surgery 3 weeks ago and problems with his son.  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  HPI   Prior to Admission medications   Medication Sig Start Date End Date Taking? Authorizing Provider  clonazePAM (KLONOPIN) 1 MG tablet TAKE 1 TABLET BY MOUTH TWICE DAILY AS NEEDED FOR ANXIETY 08/25/18  Yes Krystle Oberman, Eilleen Kempf, MD  metFORMIN (GLUCOPHAGE) 500 MG tablet TAKE 1 TABLET BY MOUTH TWICE DAILY WITH A MEAL 01/24/18  Yes Brea Coleson, Eilleen Kempf, MD  pravastatin (PRAVACHOL) 20 MG tablet Take 1 tablet (20 mg total) by mouth daily. 01/24/18  Yes Aniko Finnigan, Eilleen Kempf, MD  amLODipine (NORVASC) 5 MG tablet Take 1 tablet (5 mg total) by mouth daily. 01/24/18 08/22/18  Georgina Quint, MD  carvedilol (COREG) 25 MG tablet Take 1 tablet (25 mg total) by mouth 2 (two) times daily with a meal.  01/24/18 08/22/18  Georgina QuintSagardia, Munachimso Rigdon Jose, MD  meloxicam (MOBIC) 7.5 MG tablet Take 1 tablet (7.5 mg total) by mouth daily as needed for pain. Patient not taking: Reported on 08/22/2018 01/24/18   Georgina QuintSagardia, Shirlyn Savin Jose, MD    Allergies  Allergen Reactions  . Aspirin Other (See Comments)    Dizzy   . Other Other (See Comments)    Neurobion Forte: Skin "fell off"    Patient Active Problem List   Diagnosis Date Noted  . CAD (coronary artery disease) 01/04/2018  . Pre-diabetes 11/24/2016  . Dyslipidemia 11/24/2016  . GAD (generalized anxiety disorder) 11/01/2016  . Hypertension 01/29/2016  . Hypertensive urgency 01/29/2016    Past Medical History:  Diagnosis Date   . Anxiety   . Diabetes mellitus without complication (HCC)   . High cholesterol   . Hypertension     Past Surgical History:  Procedure Laterality Date  . CARDIAC CATHETERIZATION  2016   Most likely procedure, based on his description. Not long after arriving here. They told him nothing was wrong with his heart.    Social History   Socioeconomic History  . Marital status: Married    Spouse name: Lauris Poagmelia  . Number of children: 3  . Years of education: 4312  . Highest education level: Not on file  Occupational History  . Occupation: [employer]  Social Needs  . Financial resource strain: Not on file  . Food insecurity:    Worry: Not on file    Inability: Not on file  . Transportation needs:    Medical: Not on file    Non-medical: Not on file  Tobacco Use  . Smoking status: Former Smoker    Last attempt to quit: 09/03/2001    Years since quitting: 16.9  . Smokeless tobacco: Never Used  Substance and Sexual Activity  . Alcohol use: Yes    Comment: 3 Bud Light beers daily,   . Drug use: No  . Sexual activity: Not on file  Lifestyle  . Physical activity:    Days per week: Not on file    Minutes per session: Not on file  . Stress: Not on file  Relationships  . Social connections:    Talks on phone: Not on file    Gets together: Not on file    Attends religious service: Not on file    Active member of club or organization: Not on file    Attends meetings of clubs or organizations: Not on file    Relationship status: Not on file  . Intimate partner violence:    Fear of current or ex partner: Not on file    Emotionally abused: Not on file    Physically abused: Not on file    Forced sexual activity: Not on file  Other Topics Concern  . 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 Diabetes Mother 2960  . Stroke Father 190   . Heart disease Neg Hx      Review of Systems  Constitutional: Negative.  Negative for chills and fever.  HENT: Negative.   Eyes: Negative.  Negative for blurred vision and double vision.  Respiratory: Negative.  Negative for cough, shortness of breath and wheezing.   Cardiovascular: Negative.  Negative for chest pain and palpitations.  Gastrointestinal: Negative.  Negative for abdominal pain,  blood in stool, diarrhea, nausea and vomiting.  Genitourinary: Negative.   Skin: Negative.  Negative for rash.  Neurological: Negative.  Negative for dizziness and headaches.  Endo/Heme/Allergies: Negative.   Psychiatric/Behavioral: Positive for depression. Negative for substance abuse and suicidal ideas. The patient is nervous/anxious.   All other systems reviewed and are negative.  Vitals:   08/28/18 1121  BP: (!) 151/79  Pulse: 68  Resp: 16  Temp: 98.6 F (37 C)  SpO2: 98%     Physical Exam Vitals signs reviewed.  Constitutional:      Appearance: Normal appearance.  HENT:     Head: Normocephalic and atraumatic.     Mouth/Throat:     Mouth: Mucous membranes are moist.     Pharynx: Oropharynx is clear.  Eyes:     Extraocular Movements: Extraocular movements intact.     Pupils: Pupils are equal, round, and reactive to light.  Neck:     Musculoskeletal: Normal range of motion.  Cardiovascular:     Rate and Rhythm: Normal rate and regular rhythm.     Heart sounds: Normal heart sounds.  Pulmonary:     Effort: Pulmonary effort is normal.     Breath sounds: Normal breath sounds.  Musculoskeletal: Normal range of motion.  Skin:    General: Skin is warm and dry.  Neurological:     General: No focal deficit present.     Mental Status: He is alert and oriented to person, place, and time.  Psychiatric:        Mood and Affect: Mood normal.        Behavior: Behavior normal.      ASSESSMENT & PLAN: Clavin was seen today for dizziness and blurred vision.  Diagnoses and  all orders for this visit:  GAD (generalized anxiety disorder) -     PARoxetine (PAXIL) 20 MG tablet; Take 1 tablet (20 mg total) by mouth daily.  Essential hypertension  Situational anxiety   Clonazepam prescription refilled by me 3 days ago.   Patient Instructions       If you have lab work done today you will be contacted with your lab results within the next 2 weeks.  If you have not heard from [place] then please contact us. The fastest way to get your results is to register for My Chart.   IF you received an x-ray today, you will receive an invoice from [facility]. Please contact [facility] at [phone] with questions or concerns regarding your invoice.   IF you received labwork today, you will receive an invoice from [place]. Please contact [facility] at [phone] with questions or concerns regarding your invoice.   Our billing staff will not be able to assist you with questions regarding bills from these companies.  You will be contacted with the lab results as soon as they are available. The fastest way to get your results is to activate your My Chart account. Instructions are located on the last page of this paperwork. If you have not heard from [place] regarding the results in 2 weeks, please contact this office.     Transtorno de ansiedade generalizada, adultos Generalized Anxiety Disorder, Adult O transtorno de ansiedade generalizada (TAG)  um distrbio de sade mental. As pessoas que sofrem dessa condio ficam constantemente ansiosas com eventos do dia a dia. Diferentemente da ansiedade normal, a ansiedade relacionada ao TAG no  disparada por um evento especfico. Alm disso, essa ansiedade no diminui ou melhora com o tempo. O TAG interfere nas  funes bsicas da vida, incluindo relacionamentos, trabalho e escola. O TAG pode variar de leve a grave. Pessoas com TAG grave podem ter episdios intensos de ansiedade com sintomas fsicos (ataques de  pnico). Quais so as causas? A causa exata do TAG  desconhecida. O que aumenta o risco? Esse quadro clnico tem maior probabilidade de se desenvolver em:  Mulheres.  Pessoas com histrico familiar de transtornos de ansiedade.  Pessoas muito tmidas.  Pessoas que passam por eventos muito estressantes na vida, como a morte de Lucent Technologiespessoas amadas.  Pessoas que tm um First Data Corporationambiente familiar muito estressante. Quais so os sinais ou sintomas? Pessoas com TAG geralmente se preocupam em excesso com muitas coisas em suas vidas, como sua sade e sua famlia. Elas tambm podem ser excessivamente preocupadas com:  Serem boas no trabalho.  Serem pontuais.  Desastres naturais.  Amizades. Os sintomas fsicos do TAG incluem:  Fadiga.  Tenso muscular ou espasmos musculares.  Tremedeira ou sensao de fraqueza.  Tomar sustos com facilidade.  Sentir seu corao bater forte ou acelerado.  Sentir falta de ar ou como se no conseguisse respirar fundo.  Dificuldade para pegar no sono ou manter o sono profundo.  Sudorese.  Enjoo, diarreia ou sndrome do intestino irritvel.  Dores de cabea.  Dificuldade de Cubaconcentrao ou de Munisingmemria.  Agitao.  Irritabilidade. Como esse quadro clnico  diagnosticado? Seu mdico poder diagnosticar o TAG com base nos seus sintomas e histrico mdico. Voc tambm passar por um exame fsico. O mdico far perguntas especficas sobre seus sintomas, incluindo qual a intensidade deles, quando comearam e se surgem e passam. Seu mdico poder perguntar sobre seu uso de lcool ou drogas, incluindo medicamentos vendidos com receita. Seu mdico poder encaminh-lo a um profissional de sade mental para avaliao e tratamento adicionais. O seu mdico far um exame minucioso e poder realizar testes adicionais para descartar outras causas possveis de seus sintomas. Para ser diagnosticado com TAG, uma pessoa precisa sofrer de uma ansiedade que:  MaliEsteja fora de  controle dele ou dela.  Afete diversos aspectos diferentes de sua vida, como trabalho e relacionamentos.  Cause um nvel de angstia que o torne incapaz de participar de atividades normais.  Inclua pelo menos trs sintomas fsicos do TAG, como inquietao, fadiga, dificuldade de concentrao, irritabilidade, tenso muscular ou problemas para dormir. Antes que seu mdico possa confirmar um diagnstico de TAG, esses sintomas devem estar presentes na [place] Dianemaioria dos dias e devem durar seis meses ou [place] Pointmais. Como esse quadro clnico  tratado? As seguintes terapias so geralmente usadas para tratar o TAG:  Medicamento. Medicamentos antidepressivos em geral so prescritos para o controle dirio de Special educational needs teacherlongo prazo. Medicamentos para ansiedade podem ser [address] em casos graves, especialmente quando ocorrem ataques de pnico.  Terapia pela fala (psicoterapia). Certos tipos de terapia pela fala podem ser teis no tratamento do TAG, ao proporcionarem apoio, informaes e orientao. As opes incluem: ? Terapia cognitivo-comportamental (TCC). As pessoas aprendem habilidades e tcnicas para enfrentar e aliviar a ansiedade. Aprendem a identificar pensamentos e comportamentos irreais ou negativos e a substitu-los por conceitos positivos. ? Terapia de aceitao e compromisso (TAC). Esse tratamento ensina s pessoas como a conscincia pode ser uma forma de lidar com pensamentos e sentimentos indesejados. ? Biofeedback. Esse processo  um treinamento para que voc Therapist, artconsiga controlar as Technical brewerrespostas de seu corpo (respostas fisiolgicas) por meio de tcnicas de respirao e mtodos de relaxamento. Voc trabalhar com um terapeuta enquanto mquinas so usadas para monitorar seus sintomas fsicos.  Tcnicas de controle do  estresse. Incluem ioga, meditao e exerccio. Um especialista em sade mental poder determinar qual tratamento  melhor para voc. Algumas pessoas obtm melhoras com um nico tratamento. No entanto, outras  pessoas necessitam de uma combinao de tratamentos. Siga estas instrues em casa:  Tome medicamentos vendidos com ou sem receita mdica somente de acordo com as indicaes do seu mdico.  Tente manter uma rotina normal.  Tente prever as situaes estressantes e dedique um tempo maior para contorn-las.  Pratique todas as tcnicas de controle do estresse ou autocalmantes conforme ensinado por seu mdico.  No culpe a si mesmo por recadas ou por Acupuncturist.  Tente reconhecer seus avanos, mesmo que sejam pequenos.  Comparea a todas as consultas de acompanhamento de acordo com as orientaes do seu mdico. Isso  importante. Entre em contato com um mdico se:  Seus sintomas no melhorarem.  Seus sintomas piorarem.  Voc tiver sinais de depresso, como: ? Um humor persistentemente triste, abatido ou irritvel. ? Perda de prazer em atividades que sempre foram prazerosas. ? Alterao no peso ou no apetite. ? Alterao no sono. ? Evitar os amigos ou familiares. ? Falta de energia em tarefas normais. ? Sensao de culpa ou falta de valor. Tomasa Hose ajuda imediatamente se:  Considerar seriamente a ideia de fazer mal a si mesmo ou a outras pessoas. Se sentir vontade de ferir a si mesmo ou a terceiros ou pensar em tirar a prpria vida, procure ajuda imediatamente. Voc pode ir para o pronto-socorro mais prximo ou ligar para:  O nmero de Technical brewer (911, nos [place]).  Um servio telefnico de preveno do suicdio, como o National Suicide Prevention Lifeline, no nmero [phone]. Ele funciona 24 horas por dia. Resumo  O transtorno da ansiedade generalizada (TAG)  um distrbio de sade mental que envolve um tipo de ansiedade que no  causado por um evento especfico.  Pessoas com TAG geralmente se preocupam em excesso com muitas coisas em [employer], como sua sade e sua famlia.  O TAG pode causar sintomas fsicos como inquietao, dificuldade de  concentrao, problemas para dormir, transpirao frequente, enjoo, diarreia, dor de cabea e tremedeira ou espasmos musculares.  Um especialista em sade mental poder determinar qual tratamento  melhor para voc. Algumas pessoas obtm melhoras com um nico tratamento. No entanto, outras pessoas necessitam de uma combinao de tratamentos. Estas informaes no se destinam a substituir as recomendaes de seu mdico. No deixe de discutir quaisquer dvidas com seu mdico. Document Released: 09/12/2015 Document Revised: 12/16/2016 Document Reviewed: 12/16/2016 Elsevier Interactive Patient Education  2019 Elsevier Inc.      Edwina Barth, MD [facility]

## 2018-11-03 ENCOUNTER — Emergency Department (HOSPITAL_COMMUNITY): Admission: EM | Admit: 2018-11-03 | Discharge: 2018-11-03 | Discharge status: Against Medical Advice | Payer: Self-pay

## 2019-02-15 ENCOUNTER — Other Ambulatory Visit: Payer: Self-pay | Admitting: Emergency Medicine

## 2019-02-15 DIAGNOSIS — F411 Generalized anxiety disorder: Secondary | ICD-10-CM

## 2019-02-15 DIAGNOSIS — R7303 Prediabetes: Secondary | ICD-10-CM

## 2019-02-15 DIAGNOSIS — I10 Essential (primary) hypertension: Secondary | ICD-10-CM

## 2019-02-15 DIAGNOSIS — E785 Hyperlipidemia, unspecified: Secondary | ICD-10-CM

## 2019-02-15 NOTE — Telephone Encounter (Signed)
Requested medication (s) are due for refill today: yes  Requested medication (s) are on the active medication list: yes  Last refill:  08/25/18 #30  Future visit scheduled: No  Notes to clinic:  Unable to refill per protocol     Requested Prescriptions  Pending Prescriptions Disp Refills   clonazePAM (KLONOPIN) 1 MG tablet [Pharmacy Med Name: clonazePAM 1 MG Oral Tablet] 20 tablet 0    Sig: Take 1 tablet by mouth twice daily as needed for anxiety     Not Delegated - Psychiatry:  Anxiolytics/Hypnotics Failed - 02/15/2019  4:45 PM      Failed - This refill cannot be delegated      Failed - Urine Drug Screen completed in last 360 days.      Passed - Valid encounter within last 6 months    Recent Outpatient Visits          5 months ago GAD (generalized anxiety disorder)   Primary Care at [facility], Ines Bloomer, MD   1 year ago Essential hypertension   Primary Care at [facility], Ines Bloomer, MD   1 year ago Cough   Primary Care at [place], Ines Bloomer, MD   2 years ago Pre-diabetes   Primary Care at [facility], Ines Bloomer, MD   2 years ago Essential hypertension   Primary Care at [place], [place], MD             Signed Prescriptions Disp Refills   carvedilol (COREG) 25 MG tablet 180 tablet 0    Sig: TAKE 1 TABLET BY MOUTH TWICE DAILY WITH A MEAL     Cardiovascular:  Beta Blockers Failed - 02/15/2019  4:45 PM      Failed - Last BP in normal range    BP Readings from Last 1 Encounters:  08/28/18 (!) 151/79         Passed - Last Heart Rate in normal range    Pulse Readings from Last 1 Encounters:  08/28/18 68         Passed - Valid encounter within last 6 months    Recent Outpatient Visits          5 months ago GAD (generalized anxiety disorder)   Primary Care at [place], Ines Bloomer, MD   1 year ago Essential hypertension   Primary Care at [place], Ines Bloomer, MD   1 year ago Cough   Primary Care at [place], [place], MD   2 years ago Pre-diabetes   Primary Care at [place], [place], MD   2 years ago Essential hypertension   Primary Care at [place], [place], MD              metFORMIN (GLUCOPHAGE) 500 MG tablet 180 tablet 0    Sig: TAKE 1 TABLET BY MOUTH TWICE DAILY WITH A MEAL     Endocrinology:  Diabetes - Biguanides Failed - 02/15/2019  4:45 PM      Failed - Cr in normal range and within 360 days    Creatinine, Ser  Date Value Ref Range Status  08/22/2018 0.59 (L) 0.61 - 1.24 mg/dL Final         Failed - HBA1C is between 0 and 7.9 and within 180 days    Hemoglobin A1C  Date Value Ref Range Status  02/21/2017 5.8  Final   Hgb A1c MFr Bld  Date Value Ref Range Status  11/01/2016 6.2 (H) 4.8 -  5.6 % Final    Comment:             Pre-diabetes: 5.7 - 6.4          Diabetes: >6.4          Glycemic control for adults with diabetes: <7.0          Passed - eGFR in normal range and within 360 days    GFR calc Af Amer  Date Value Ref Range Status  08/22/2018 >60 >60 mL/min Final   GFR calc non Af Amer  Date Value Ref Range Status  08/22/2018 >60 >60 mL/min Final         Passed - Valid encounter within last 6 months    Recent Outpatient Visits          5 months ago GAD (generalized anxiety disorder)   Primary Care at [place], Ines Bloomer, MD   1 year ago Essential hypertension   Primary Care at [place], [place], MD   1 year ago Cough   Primary Care at [place], Ines Bloomer, MD   2 years ago Pre-diabetes   Primary Care at [place], [place], MD   2 years ago Essential hypertension   Primary Care at [place], [place], MD              pravastatin (PRAVACHOL) 20 MG tablet 90 tablet 0    Sig: Take 1 tablet by mouth once daily     Cardiovascular:  Antilipid - Statins Failed - 02/15/2019  4:45 PM      Failed - Total Cholesterol in normal range and within 360 days    Cholesterol, Total   Date Value Ref Range Status  11/01/2016 193 100 - 199 mg/dL Final   Cholesterol  Date Value Ref Range Status  01/04/2018 163 0 - 200 mg/dL Final         Failed - LDL in normal range and within 360 days    LDL Calculated  Date Value Ref Range Status  11/01/2016 108 (H) 0 - 99 mg/dL Final   LDL Cholesterol  Date Value Ref Range Status  01/04/2018 105 (H) 0 - 99 mg/dL Final    Comment:           Total Cholesterol/HDL:CHD Risk Coronary Heart Disease Risk Table                     Men   Women  1/2 Average Risk   3.4   3.3  Average Risk       5.0   4.4  2 X Average Risk   9.6   7.1  3 X Average Risk  23.4   11.0        Use the calculated Patient Ratio above and the CHD Risk Table to determine the patient's CHD Risk.        ATP III CLASSIFICATION (LDL):  <100     mg/dL   Optimal  100-129  mg/dL   Near or Above                    Optimal  130-159  mg/dL   Borderline  160-189  mg/dL   High  >190     mg/dL   Very High Performed at [facility] [address] [place], [place] [address]          Failed - HDL in normal range and within 360 days  HDL  Date Value Ref Range Status  01/04/2018 47 >40 mg/dL Final  11/01/2016 44 >39 mg/dL Final         Failed - Triglycerides in normal range and within 360 days    Triglycerides  Date Value Ref Range Status  01/04/2018 53 <150 mg/dL Final         Passed - Patient is not pregnant      Passed - Valid encounter within last 12 months    Recent Outpatient Visits          5 months ago GAD (generalized anxiety disorder)   Primary Care at [place], Ines Bloomer, MD   1 year ago Essential hypertension   Primary Care at [place], Ines Bloomer, MD   1 year ago Cough   Primary Care at [facility], Ines Bloomer, MD   2 years ago Pre-diabetes   Primary Care at [place], [place], MD   2 years ago Essential hypertension   Primary Care at [place], [place], MD               amLODipine (NORVASC) 5 MG tablet 90 tablet 0    Sig: Take 1 tablet by mouth once daily     Cardiovascular:  Calcium Channel Blockers Failed - 02/15/2019  4:45 PM      Failed - Last BP in normal range    BP Readings from Last 1 Encounters:  08/28/18 (!) 151/79         Passed - Valid encounter within last 6 months    Recent Outpatient Visits          5 months ago GAD (generalized anxiety disorder)   Primary Care at [place], Ines Bloomer, MD   1 year ago Essential hypertension   Primary Care at [place], Ines Bloomer, MD   1 year ago Cough   Primary Care at [facility], Ines Bloomer, MD   2 years ago Pre-diabetes   Primary Care at [place], Ines Bloomer, MD   2 years ago Essential hypertension   Primary Care at [facility], [place], [place]

## 2019-02-16 NOTE — Telephone Encounter (Signed)
Please advise on the controlled med Clonazepam. Last filled 08/25/18. Last seen 08/28/18

## 2019-04-12 IMAGING — CR DG CHEST 2V
2 series · 2 of 2 positions shown · non-contrast
Comparison: 01/04/2018.

CLINICAL DATA: Central chest tightness and shortness of breath.

EXAM:
CHEST - 2 VIEW

[w chest pa]
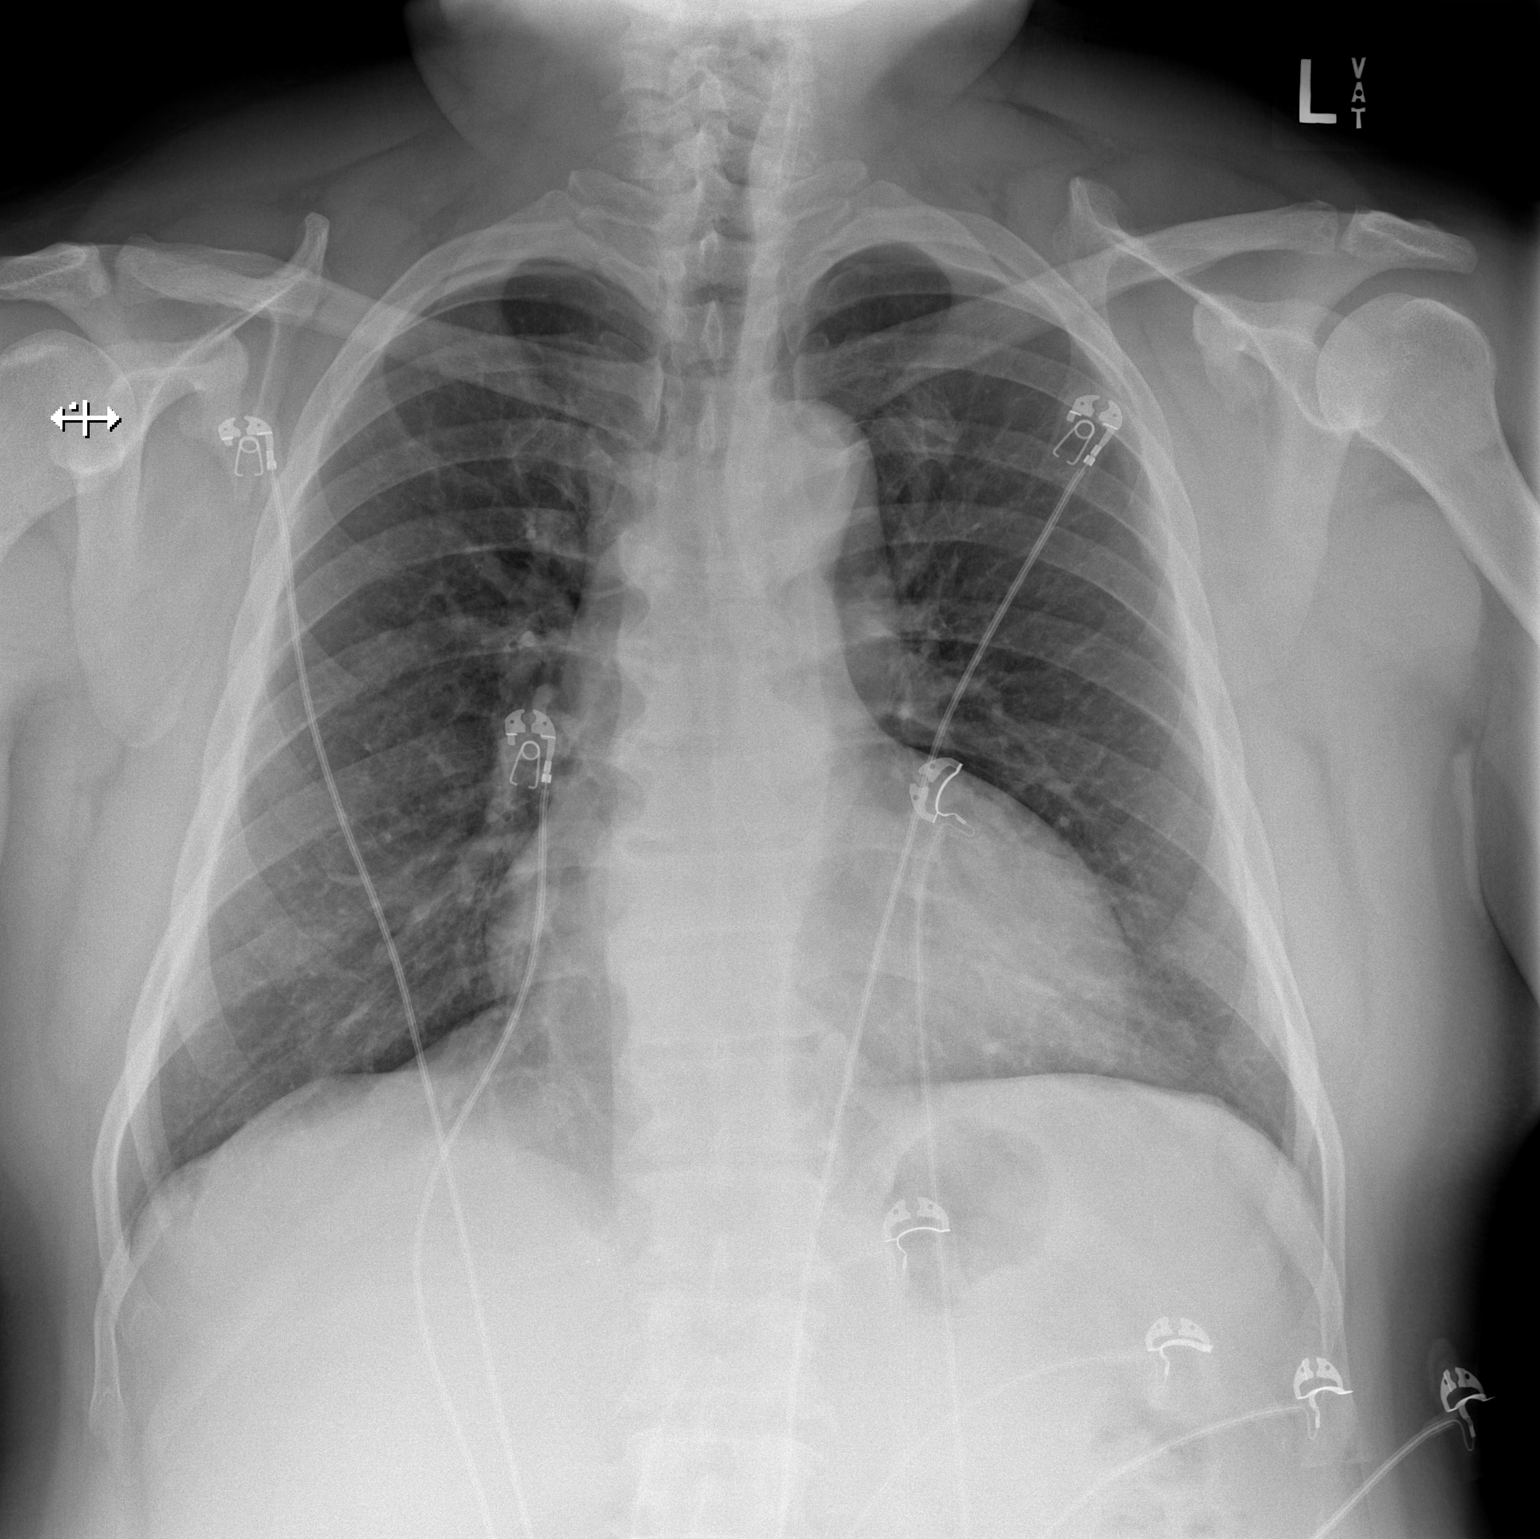

[w chest lat]
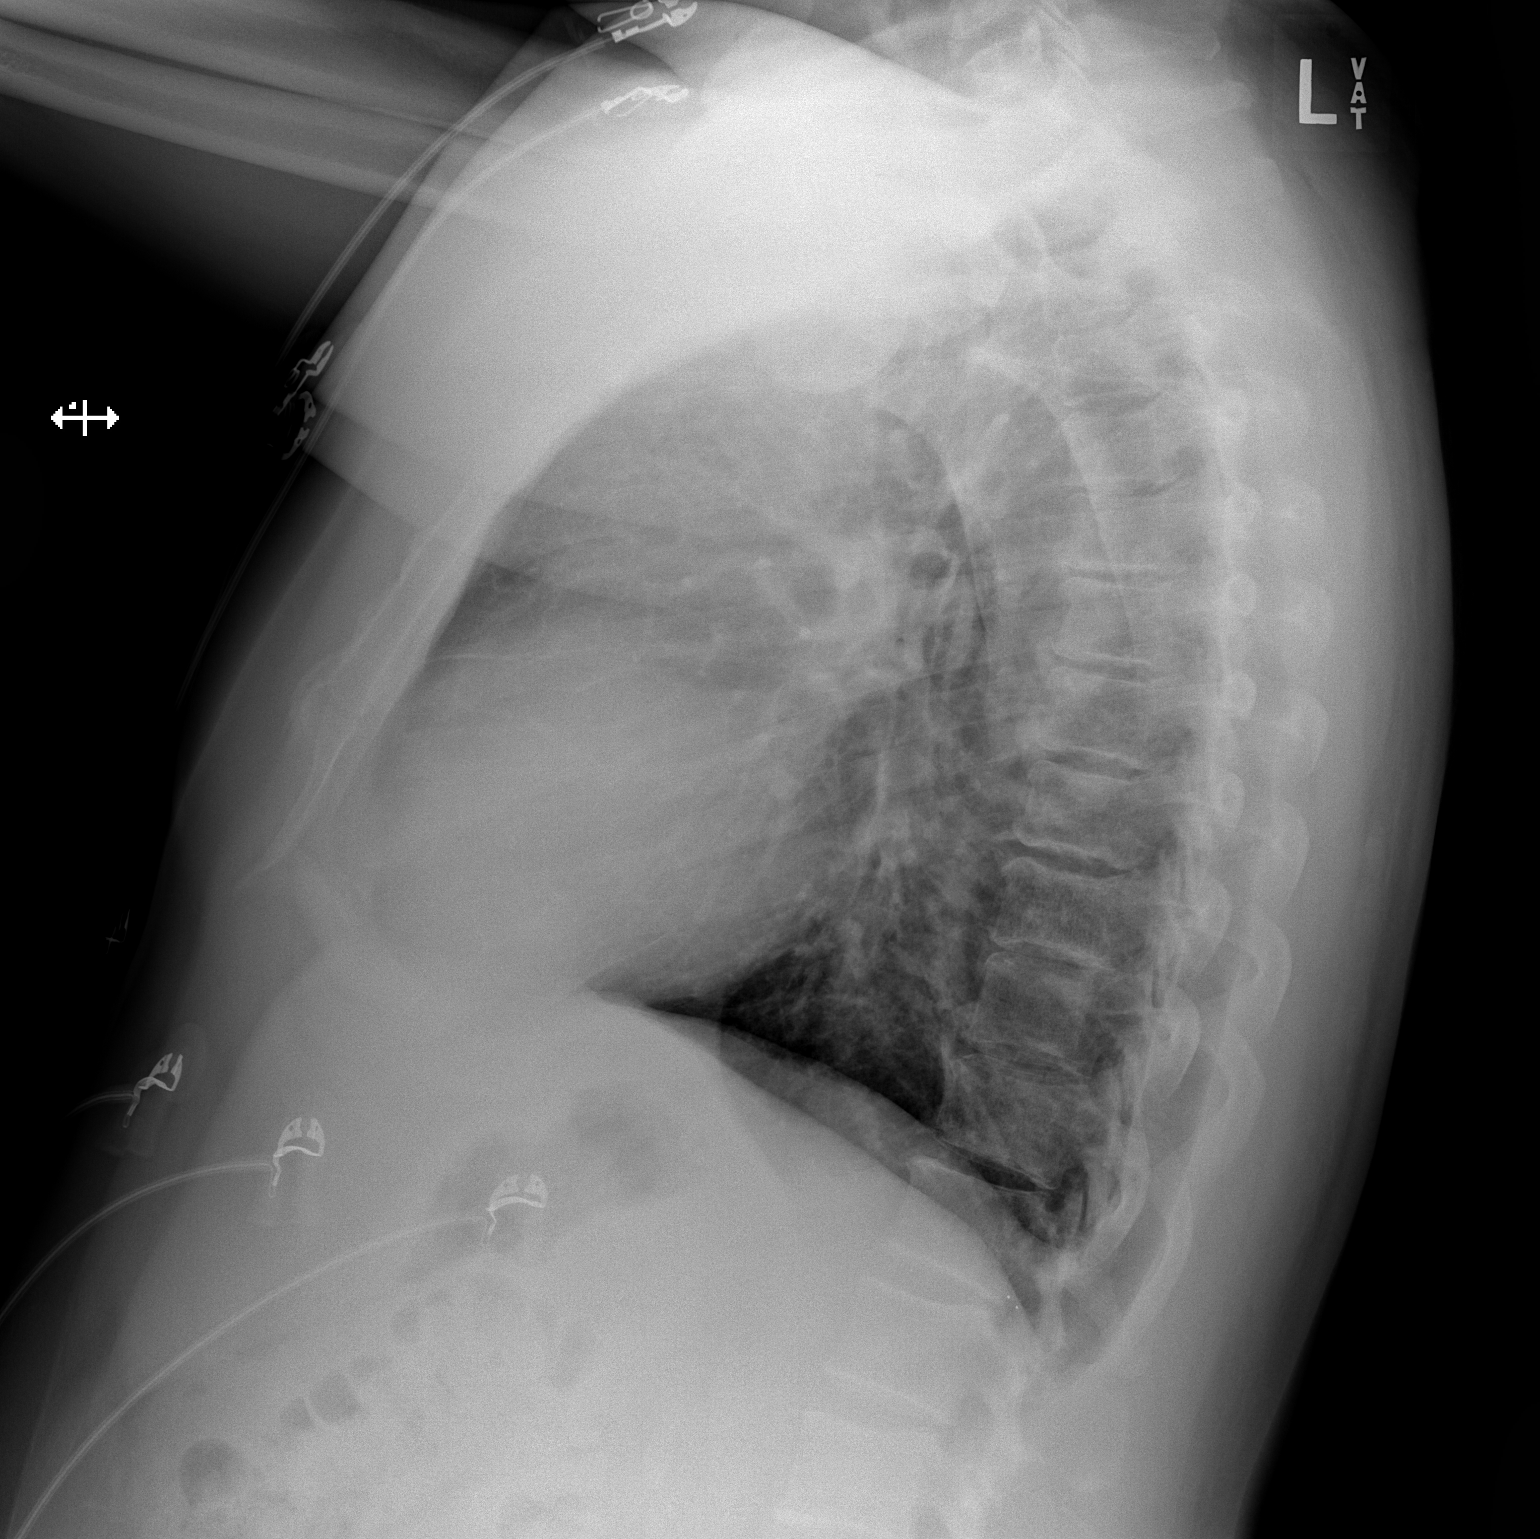

[2 of 2 positions shown; findings below may reference images not displayed]

FINDINGS: Stable borderline enlarged cardiac silhouette, clear lungs and
normal vascularity. Thoracic spine degenerative changes.
IMPRESSION: No acute abnormality.

## 2019-04-16 ENCOUNTER — Other Ambulatory Visit: Payer: Self-pay | Admitting: Emergency Medicine

## 2019-04-16 DIAGNOSIS — I10 Essential (primary) hypertension: Secondary | ICD-10-CM

## 2019-05-05 ENCOUNTER — Other Ambulatory Visit: Payer: Self-pay | Admitting: Emergency Medicine

## 2019-05-05 DIAGNOSIS — I10 Essential (primary) hypertension: Secondary | ICD-10-CM

## 2019-05-23 ENCOUNTER — Other Ambulatory Visit: Payer: Self-pay | Admitting: Emergency Medicine

## 2019-05-23 DIAGNOSIS — F411 Generalized anxiety disorder: Secondary | ICD-10-CM

## 2019-05-24 NOTE — Telephone Encounter (Signed)
Requested Prescriptions   Pending Prescriptions Disp Refills  . clonazePAM (KLONOPIN) 1 MG tablet [Pharmacy Med Name: clonazePAM 1 MG Oral Tablet] 20 tablet 0    Sig: TAKE 1 TABLET BY MOUTH ONCE DAILY AS NEEDED FOR  ANXIETY.  DO  NOT  TAKE  EVERY  DAY    Last OV 08/28/2018  Last written 02/18/2019

## 2019-06-11 ENCOUNTER — Other Ambulatory Visit: Payer: Self-pay | Admitting: Emergency Medicine

## 2019-06-11 DIAGNOSIS — R7303 Prediabetes: Secondary | ICD-10-CM

## 2019-08-09 ENCOUNTER — Other Ambulatory Visit: Payer: Self-pay | Admitting: Emergency Medicine

## 2019-08-09 DIAGNOSIS — R7303 Prediabetes: Secondary | ICD-10-CM

## 2019-08-09 DIAGNOSIS — I10 Essential (primary) hypertension: Secondary | ICD-10-CM

## 2019-08-09 NOTE — Telephone Encounter (Signed)
Requested medication (s) are due for refill today: yes  Requested medication (s) are on the active medication list: yes  Last refill:  05/09/2019  Future visit scheduled: no  Notes to clinic: review for refill Overdue for office visit    Requested Prescriptions  Pending Prescriptions Disp Refills   amLODipine (NORVASC) 5 MG tablet [Pharmacy Med Name: amLODIPine Besylate 5 MG Oral Tablet] 30 tablet 0    Sig: TAKE 1 TABLET BY MOUTH ONCE DAILY **PATIENT NEEDS OFFICE VISIT FOR ADDITIONAL REFILLS**      Cardiovascular:  Calcium Channel Blockers Failed - 08/09/2019 10:19 AM      Failed - Last BP in normal range    BP Readings from Last 1 Encounters:  08/28/18 (!) 151/79          Failed - Valid encounter within last 6 months    Recent Outpatient Visits           11 months ago GAD (generalized anxiety disorder)   [facility] [place], Ines Bloomer, MD   1 year ago Essential hypertension   [facility] [place], Ines Bloomer, MD   2 years ago Cough   [facility], Ines Bloomer, MD   2 years ago Pre-diabetes   [facility] [place], [place], MD   2 years ago Essential hypertension   [facility] [place], [place], MD                carvedilol (COREG) 25 MG tablet [Pharmacy Med Name: Carvedilol 25 MG Oral Tablet] 60 tablet 0    Sig: TAKE 1 TABLET BY MOUTH TWICE DAILY WITH A MEAL **OFFICE VISIT IS NEEDED FOR REFILLS**      Cardiovascular:  Beta Blockers Failed - 08/09/2019 10:19 AM      Failed - Last BP in normal range    BP Readings from Last 1 Encounters:  08/28/18 (!) 151/79          Failed - Valid encounter within last 6 months    Recent Outpatient Visits           11 months ago GAD (generalized anxiety disorder)   [facility] [place], Ines Bloomer, MD   1 year ago Essential hypertension   [facility] [place], Ines Bloomer, MD   2 years ago Cough   [facility] at [facility], Ines Bloomer, MD   2 years ago Pre-diabetes   [facility] [place], [place], MD   2 years ago Essential hypertension   [facility] [place], [place], MD              [place] in normal range    Pulse Readings from Last 1 Encounters:  08/28/18 68            metFORMIN (GLUCOPHAGE) 500 MG tablet [Pharmacy Med Name: metFORMIN HCl 500 MG Oral Tablet] 60 tablet 0    Sig: TAKE 1 TABLET BY MOUTH TWICE DAILY WITH A MEAL **SCHEDULE  APPOINTMENT  FOR  ADDITIONAL  REFILLS**      Endocrinology:  Diabetes - Biguanides Failed - 08/09/2019 10:19 AM      Failed - Cr in normal range and within 360 days    Creatinine, Ser  Date Value Ref Range Status  08/22/2018 0.59 (L) 0.61 - 1.24 mg/dL Final          Failed - HBA1C is between 0  and 7.9 and within 180 days    Hemoglobin A1C  Date Value Ref Range Status  02/21/2017 5.8  Final   Hgb A1c MFr Bld  Date Value Ref Range Status  11/01/2016 6.2 (H) 4.8 - 5.6 % Final    Comment:             Pre-diabetes: 5.7 - 6.4          Diabetes: >6.4          Glycemic control for adults with diabetes: <7.0           Failed - Valid encounter within last 6 months    Recent Outpatient Visits           11 months ago GAD (generalized anxiety disorder)   [facility] [place], Ines Bloomer, MD   1 year ago Essential hypertension   [facility] [place], Ines Bloomer, MD   2 years ago Cough   [facility] at [facility], Ines Bloomer, MD   2 years ago Pre-diabetes   [facility] at [place], Ines Bloomer, MD   2 years ago Essential hypertension   [facility] [place], [place], MD              Passed - eGFR in normal range and within 360 days    GFR calc Af Wyvonnia Lora  Date Value Ref Range Status  08/22/2018 >60 >60 mL/min Final   GFR calc non Af Amer  Date Value Ref Range Status  08/22/2018 >60 >60 mL/min Final

## 2019-08-13 ENCOUNTER — Other Ambulatory Visit: Payer: Self-pay | Admitting: Emergency Medicine

## 2019-08-13 DIAGNOSIS — I10 Essential (primary) hypertension: Secondary | ICD-10-CM

## 2019-08-13 DIAGNOSIS — R7303 Prediabetes: Secondary | ICD-10-CM

## 2019-08-20 ENCOUNTER — Other Ambulatory Visit: Payer: Self-pay | Admitting: Emergency Medicine

## 2019-08-20 DIAGNOSIS — F411 Generalized anxiety disorder: Secondary | ICD-10-CM

## 2019-08-20 DIAGNOSIS — I10 Essential (primary) hypertension: Secondary | ICD-10-CM

## 2019-08-20 DIAGNOSIS — R7303 Prediabetes: Secondary | ICD-10-CM

## 2019-08-20 DIAGNOSIS — E785 Hyperlipidemia, unspecified: Secondary | ICD-10-CM

## 2019-08-21 ENCOUNTER — Ambulatory Visit: Payer: Self-pay | Admitting: Emergency Medicine

## 2019-08-21 NOTE — Telephone Encounter (Signed)
Please schedule patient a follow up appt for medication refill

## 2019-08-23 ENCOUNTER — Encounter: Payer: Self-pay | Admitting: Emergency Medicine

## 2019-08-25 ENCOUNTER — Other Ambulatory Visit: Payer: Self-pay | Admitting: Emergency Medicine

## 2019-08-25 DIAGNOSIS — R7303 Prediabetes: Secondary | ICD-10-CM

## 2019-08-25 DIAGNOSIS — F411 Generalized anxiety disorder: Secondary | ICD-10-CM

## 2019-08-25 DIAGNOSIS — I10 Essential (primary) hypertension: Secondary | ICD-10-CM

## 2019-08-25 NOTE — Telephone Encounter (Signed)
Forwarding medication refill requests to the clinical pool for review. 

## 2019-08-27 NOTE — Telephone Encounter (Signed)
Pt does not have insurance and wanted pcp to fill prescription without appt. Spoke with pt in office and advised him that pcp will likely not refill w/o appt. Pt stated that he agreed, made same day appt and then no showed.

## 2019-08-27 NOTE — Telephone Encounter (Signed)
fyi

## 2019-08-27 NOTE — Telephone Encounter (Signed)
Patient need an appt in order to get any further refills

## 2019-08-27 NOTE — Telephone Encounter (Signed)
Agree. Thanks

## 2019-09-06 NOTE — Telephone Encounter (Signed)
[facility] called to follow up / patient needs appt before he gets refills FR

## 2019-09-06 NOTE — Telephone Encounter (Signed)
Called patient to get him a appt for refills pt will c/b bev=cause he is working [facility]

## 2019-09-06 NOTE — Telephone Encounter (Signed)
Please schedule patient appt for refills 

## 2019-11-05 ENCOUNTER — Telehealth: Payer: Self-pay | Admitting: Emergency Medicine

## 2020-05-01 ENCOUNTER — Encounter: Payer: Self-pay | Admitting: Emergency Medicine

## 2020-05-01 ENCOUNTER — Other Ambulatory Visit: Payer: Self-pay

## 2020-05-01 ENCOUNTER — Ambulatory Visit (INDEPENDENT_AMBULATORY_CARE_PROVIDER_SITE_OTHER): Payer: Self-pay | Admitting: Emergency Medicine

## 2020-05-01 VITALS — BP 172/99 | HR 85 | Temp 98.8°F | Ht 63.0 in | Wt 206.0 lb

## 2020-05-01 DIAGNOSIS — R7303 Prediabetes: Secondary | ICD-10-CM

## 2020-05-01 DIAGNOSIS — F411 Generalized anxiety disorder: Secondary | ICD-10-CM

## 2020-05-01 DIAGNOSIS — Z6836 Body mass index (BMI) 36.0-36.9, adult: Secondary | ICD-10-CM

## 2020-05-01 DIAGNOSIS — I10 Essential (primary) hypertension: Secondary | ICD-10-CM

## 2020-05-01 DIAGNOSIS — E785 Hyperlipidemia, unspecified: Secondary | ICD-10-CM

## 2020-05-01 LAB — COMPREHENSIVE METABOLIC PANEL
ALT: 73 IU/L — ABNORMAL HIGH (ref 0–44)
AST: 97 IU/L — ABNORMAL HIGH (ref 0–40)
Albumin/Globulin Ratio: 1 — ABNORMAL LOW (ref 1.2–2.2)
Albumin: 4.1 g/dL (ref 4.0–5.0)
Alkaline Phosphatase: 118 IU/L (ref 48–121)
BUN/Creatinine Ratio: 10 (ref 9–20)
BUN: 7 mg/dL (ref 6–24)
Bilirubin Total: 0.5 mg/dL (ref 0.0–1.2)
CO2: 25 mmol/L (ref 20–29)
Calcium: 8.6 mg/dL — ABNORMAL LOW (ref 8.7–10.2)
Chloride: 103 mmol/L (ref 96–106)
Creatinine, Ser: 0.7 mg/dL — ABNORMAL LOW (ref 0.76–1.27)
GFR calc Af Amer: 130 mL/min/{1.73_m2} (ref >59)
GFR calc non Af Amer: 112 mL/min/{1.73_m2} (ref >59)
Globulin, Total: 4.2 g/dL (ref 1.5–4.5)
Glucose: 118 mg/dL — ABNORMAL HIGH (ref 65–99)
Potassium: 3.7 mmol/L (ref 3.5–5.2)
Sodium: 142 mmol/L (ref 134–144)
Total Protein: 8.3 g/dL (ref 6.0–8.5)

## 2020-05-01 LAB — LIPID PANEL
Chol/HDL Ratio: 3.9 ratio (ref 0.0–5.0)
Cholesterol, Total: 213 mg/dL — ABNORMAL HIGH (ref 100–199)
HDL: 54 mg/dL (ref >39)
LDL Chol Calc (NIH): 132 mg/dL — ABNORMAL HIGH (ref 0–99)
Triglycerides: 151 mg/dL — ABNORMAL HIGH (ref 0–149)
VLDL Cholesterol Cal: 27 mg/dL (ref 5–40)

## 2020-05-01 MED ORDER — PRAVASTATIN SODIUM 20 MG PO TABS: 20.0000 mg | ORAL_TABLET | Freq: Every day | ORAL | 1 refills | Status: AC

## 2020-05-01 MED ORDER — AMLODIPINE BESYLATE 5 MG PO TABS: 5.0000 mg | ORAL_TABLET | Freq: Every day | ORAL | 1 refills | Status: DC

## 2020-05-01 MED ORDER — CLONAZEPAM 1 MG PO TABS: ORAL_TABLET | ORAL | 0 refills | Status: DC

## 2020-05-01 MED ORDER — METFORMIN HCL 500 MG PO TABS: 500.0000 mg | ORAL_TABLET | Freq: Two times a day (BID) | ORAL | 1 refills | Status: DC

## 2020-05-01 MED ORDER — CARVEDILOL 25 MG PO TABS: ORAL_TABLET | ORAL | 1 refills | Status: AC

## 2020-05-01 NOTE — Patient Instructions (Addendum)
   If you have lab work done today you will be contacted with your lab results within the next 2 weeks.  If you have not heard from us then please contact us. The fastest way to get your results is to register for My Chart.   IF you received an x-ray today, you will receive an invoice from [facility]. Please contact [facility] at [phone] with questions or concerns regarding your invoice.   IF you received labwork today, you will receive an invoice from [facility]. Please contact [facility] at [phone] with questions or concerns regarding your invoice.   Our billing staff will not be able to assist you with questions regarding bills from these companies.  You will be contacted with the lab results as soon as they are available. The fastest way to get your results is to activate your My Chart account. Instructions are located on the last page of this paperwork. If you have not heard from us regarding the results in 2 weeks, please contact this office.      Hipertensin en los adultos Hypertension, Adult El trmino hipertensin es otra forma de denominar a la presin arterial elevada. La presin arterial elevada fuerza al corazn a trabajar ms para bombear la sangre. Esto puede causar problemas con el paso del tiempo. Una lectura de presin arterial est compuesta por 2 nmeros. Hay un nmero superior (sistlico) sobre un nmero inferior (diastlico). Lo ideal es tener la presin arterial por debajo de 120/80. Las elecciones saludables pueden ayudar a bajar la presin arterial, o tal vez necesite medicamentos para bajarla. Cules son las causas? Se desconoce la causa de esta afeccin. Algunas afecciones pueden estar relacionadas con la presin arterial alta. Qu incrementa el riesgo?  Fumar.  Tener diabetes mellitus tipo 2, colesterol alto, o ambos.  No hacer la cantidad suficiente de actividad fsica o ejercicio.  Tener sobrepeso.  Consumir mucha grasa,  azcar, caloras o sal (sodio) en su dieta.  Beber alcohol en exceso.  Tener una enfermedad renal a largo plazo (crnica).  Tener antecedentes familiares de presin arterial alta.  Edad. Los riesgos aumentan con la edad.  Raza. El riesgo es mayor para las personas afroamericanas.  Sexo. Antes de los 45aos, los hombres corren ms riesgo que las mujeres. Despus de los 65aos, las mujeres corren ms riesgo que los hombres.  Tener apnea obstructiva del sueo.  Estrs. Cules son los signos o los sntomas?  Es posible que la presin arterial alta puede no cause sntomas. La presin arterial muy alta (crisis hipertensiva) puede provocar: ? Dolor de cabeza. ? Sensaciones de preocupacin o nerviosismo (ansiedad). ? Falta de aire. ? Hemorragia nasal. ? Sensacin de malestar en el estmago (nuseas). ? Vmitos. ? Cambios en la forma de ver. ? Dolor muy intenso en el pecho. ? Convulsiones. Cmo se trata?  Esta afeccin se trata haciendo cambios saludables en el estilo de vida, por ejemplo: ? Consumir alimentos saludables. ? Hacer ms ejercicio. ? Beber menos alcohol.  El mdico puede recetarle medicamentos si los cambios en el estilo de vida no son suficientes para lograr controlar la presin arterial y si: ? El nmero de arriba est por encima de 130. ? El nmero de abajo est por encima de 80.  Su presin arterial personal ideal puede variar. Siga estas instrucciones en su casa: Comida y bebida   Si se lo dicen, siga el plan de alimentacin de DASH (Dietary Approaches to Stop Hypertension, Maneras de alimentarse para detener la hipertensin).   Para seguir este plan: ? Llene la mitad del plato de cada comida con frutas y verduras. ? Llene un cuarto del plato de cada comida con cereales integrales. Los cereales integrales incluyen pasta integral, arroz integral y pan integral. ? Coma y beba productos lcteos con bajo contenido de grasa, como leche descremada o yogur bajo en  grasas. ? Llene un cuarto del plato de cada comida con protenas bajas en grasa (magras). Las protenas bajas en grasa incluyen pescado, pollo sin piel, huevos, frijoles y tofu. ? Evite consumir carne grasa, carne curada y procesada, o pollo con piel. ? Evite consumir alimentos prehechos o procesados.  Consuma menos de 1500 mg de sal por da.  No beba alcohol si: ? El mdico le indica que no lo haga. ? Est embarazada, puede estar embarazada o est tratando de quedar embarazada.  Si bebe alcohol: ? Limite la cantidad que bebe a lo siguiente:  De 0 a 1 medida por da para las mujeres.  De 0 a 2 medidas por da para los hombres. ? Est atento a la cantidad de alcohol que hay en las bebidas que toma. En los [place], una medida equivale a una botella de cerveza de 12oz (355ml), un vaso de vino de 5oz (148ml) o un vaso de una bebida alcohlica de alta graduacin de 1oz (44ml). Estilo de vida   Trabaje con su mdico para mantenerse en un peso saludable o para perder peso. Pregntele a su mdico cul es el peso recomendable para usted.  Haga al menos 30minutos de ejercicio la mayora de los das de la semana. Estos pueden incluir caminar, nadar o andar en bicicleta.  Realice al menos 30 minutos de ejercicio que fortalezca sus msculos (ejercicios de resistencia) al menos 3 das a la semana. Estos pueden incluir levantar pesas o hacer [place].  No consuma ningn producto que contenga nicotina o tabaco, como cigarrillos, cigarrillos electrnicos y tabaco de mascar. Si necesita ayuda para dejar de fumar, consulte al mdico.  Controle su presin arterial en su casa tal como le indic el mdico.  Concurra a todas las visitas de seguimiento como se lo haya indicado el mdico. Esto es importante. Medicamentos  Tome los medicamentos de venta libre y los recetados solamente como se lo haya indicado el mdico. Siga cuidadosamente las indicaciones.  No omita las dosis de medicamentos  para la presin arterial. Los medicamentos pierden eficacia si omite dosis. El hecho de omitir las dosis tambin aumenta el riesgo de otros problemas.  Pregntele a su mdico a qu efectos secundarios o reacciones a los medicamentos debe prestar atencin. Comunquese con un mdico si:  Piensa que tiene una reaccin a los medicamentos que est tomando.  Tiene dolores de cabeza frecuentes (recurrentes).  Se siente mareado.  Tiene hinchazn en los tobillos.  Tiene problemas de visin. Solicite ayuda inmediatamente si:  Siente un dolor de cabeza muy intenso.  Empieza a sentirse desorientado (confundido).  Se siente dbil o adormecido.  Siente que va a desmayarse.  Tiene un dolor muy intenso en las siguientes zonas: ? Pecho. ? Vientre (abdomen).  Vomita ms de una vez.  Tiene dificultad para respirar. Resumen  El trmino hipertensin es otra forma de denominar a la presin arterial elevada.  La presin arterial elevada fuerza al corazn a trabajar ms para bombear la sangre.  Para la mayora de las personas, una presin arterial normal es menor que 120/80.  Las decisiones saludables pueden ayudarle a disminuir su presin arterial. Si no puede   bajar su presin arterial mediante decisiones saludables, es posible que deba tomar medicamentos. Esta informacin no tiene como fin reemplazar el consejo del mdico. Asegrese de hacerle al mdico cualquier pregunta que tenga. Document Revised: 06/01/2018 Document Reviewed: 06/01/2018 Elsevier Patient Education  2020 [employer]  

## 2020-05-01 NOTE — Progress Notes (Signed)
Cameron Haney 49 y.o.   Chief Complaint  Patient presents with  . Medication Refill    patient need refills on all med. Patient has been out med for a year now.     HISTORY OF PRESENT ILLNESS: This is a 49 y.o. male here for follow-up of several chronic medical problems and medication refill.  Has been off medications for several months. 1.  Hypertension: Was on amlodipine 5 mg daily and carvedilol 25 mg twice a day. 2.  Prediabetes: On Metformin 500 mg twice a day. 3.  Dyslipidemia: On pravastatin 20 mg daily 4.  Generalized anxiety disorder: No longer taking Paxil.  Feels fine.  May take clonazepam as needed but not on a regular basis. No other complaints or medical concerns today. Fully vaccinated against Covid.  HPI   Prior to Admission medications   Medication Sig Start Date End Date Taking? Authorizing Provider  amLODipine (NORVASC) 5 MG tablet Take 1 tablet (5 mg total) by mouth daily. 05/01/20 07/30/20 Yes Talonda Artist, Eilleen Kempf, MD  carvedilol (COREG) 25 MG tablet TAKE 1 TABLET BY MOUTH TWICE DAILY WITH A MEAL 05/01/20  Yes Whitley Strycharz, Eilleen Kempf, MD  clonazePAM (KLONOPIN) 1 MG tablet TAKE 1 TABLET BY MOUTH ONCE DAILY AS NEEDED FOR  ANXIETY.  DO  NOT  TAKE  EVERY  DAY 05/01/20  Yes Amador Braddy, Eilleen Kempf, MD  metFORMIN (GLUCOPHAGE) 500 MG tablet Take 1 tablet (500 mg total) by mouth 2 (two) times daily with a meal. TAKE 1 TABLET BY MOUTH TWICE DAILY WITH A MEAL. Schedule appointment for additional refills. 05/01/20 07/30/20 Yes Omaira Mellen, Eilleen Kempf, MD  pravastatin (PRAVACHOL) 20 MG tablet Take 1 tablet (20 mg total) by mouth daily. 05/01/20  Yes Porcia Morganti, Eilleen Kempf, MD  PARoxetine (PAXIL) 20 MG tablet Take 1 tablet (20 mg total) by mouth daily. 08/28/18 11/26/18  Georgina Quint, MD    Allergies  Allergen Reactions  . Aspirin Other (See Comments)    Dizzy   . Other Other (See Comments)    Neurobion Forte: Skin "fell off"    Patient Active Problem List   Diagnosis  Date Noted  . Situational anxiety 08/28/2018  . CAD (coronary artery disease) 01/04/2018  . Pre-diabetes 11/24/2016  . Dyslipidemia 11/24/2016  . GAD (generalized anxiety disorder) 11/01/2016  . Essential hypertension 01/29/2016  . Hypertensive urgency 01/29/2016    Past Medical History:  Diagnosis Date  . Anxiety   . Diabetes mellitus without complication (HCC)   . High cholesterol   . Hypertension     Past Surgical History:  Procedure Laterality Date  . CARDIAC CATHETERIZATION  2016   Most likely procedure, based on his description. Not long after arriving here. They told him nothing was wrong with his heart.    Social History   Socioeconomic History  . Marital status: Married    Spouse name: Lauris Poag  . Number of children: 3  . Years of education: 62  . Highest education level: Not on file  Occupational History  . Occupation: [employer]  Tobacco Use  . Smoking status: Former Smoker    Quit date: 09/03/2001    Years since quitting: 18.6  . Smokeless tobacco: Never Used  Substance and Sexual Activity  . Alcohol use: Yes    Comment: 3 Bud Light beers daily,   . Drug use: No  . Sexual activity: Not on file  Other Topics Concern  . 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 Difficulty of Paying Living Expenses: Not on file  Food Insecurity:   . Worried About Programme researcher, broadcasting/film/video in the Last Year: Not on file  . Ran Out of Food in the Last Year: Not on file  Transportation Needs:   . Lack of Transportation (Medical): Not on file  . Lack of Transportation (Non-Medical): Not on file  Physical Activity:   . Days of Exercise per Week: Not on file  . Minutes of Exercise per Session: Not on file  Stress:   . Feeling of Stress : Not on file  Social Connections:   . Frequency of Communication with Friends  and Family: Not on file  . Frequency of Social Gatherings with Friends and Family: Not on file  . Attends Religious Services: Not on file  . Active Member of Clubs or Organizations: Not on file  . Attends Banker Meetings: Not on file  . Marital Status: Not on file  Intimate Partner Violence:   . Fear of Current or Ex-Partner: Not on file  . Emotionally Abused: Not on file  . Physically Abused: Not on file  . Sexually Abused: Not on file    Family History  Problem Relation Age of Onset  . Diabetes Mother 89  . Stroke Father 1  . Heart disease Neg Hx      Review of Systems  Constitutional: Negative.  Negative for chills and fever.  HENT: Negative.  Negative for congestion and sore throat.   Respiratory: Negative.  Negative for cough and shortness of breath.   Cardiovascular: Negative.  Negative for chest pain and palpitations.  Gastrointestinal: Negative.  Negative for abdominal pain, nausea and vomiting.  Genitourinary: Negative.  Negative for dysuria and hematuria.  Musculoskeletal: Negative for back pain, myalgias and neck pain.  Skin: Negative.  Negative for rash.  Neurological: Negative.  Negative for dizziness and headaches.  Endo/Heme/Allergies: Negative.   All other systems reviewed and are negative.   Today's Vitals   05/01/20 1126 05/01/20 1135  BP: (!) 174/104 (!) 172/99  Pulse: 85   Temp: 98.8 F (37.1 C)   TempSrc: Temporal   SpO2: 97%   Weight: 206 lb (93.4 kg)   Height: 5\' 3"  (1.6 m)    Body mass index is 36.49 kg/m.  Physical Exam Vitals reviewed.  Constitutional:      Appearance: Normal appearance. He is obese.  HENT:     Head: Normocephalic.  Eyes:     Extraocular Movements: Extraocular movements intact.     Pupils: Pupils are equal, round, and reactive to light.  Cardiovascular:     Rate and Rhythm: Normal rate and regular rhythm.     Pulses: Normal pulses.     Heart sounds: Normal heart sounds.  Pulmonary:     Effort:  Pulmonary effort is normal.     Breath sounds: Normal breath sounds.  Musculoskeletal:        General: Normal range of motion.     Cervical back: Normal range of motion and neck supple.  Skin:    General: Skin is warm and dry.     Capillary Refill: Capillary refill takes less than 2 seconds.  Neurological:     General: No focal deficit present.     Mental Status: He is alert and oriented to person, place, and time.  Psychiatric:        Mood and Affect: Mood normal.        Behavior: Behavior normal.      ASSESSMENT & PLAN: Cameron Haney was seen today for medication refill.  Diagnoses and all orders for this visit:  Essential hypertension -     Comprehensive metabolic panel -     amLODipine (NORVASC) 5 MG tablet; Take 1 tablet (5 mg total) by mouth daily. -     carvedilol (COREG) 25 MG tablet; TAKE 1 TABLET BY MOUTH TWICE DAILY WITH A MEAL  GAD (generalized anxiety disorder) -     clonazePAM (KLONOPIN) 1 MG tablet; TAKE 1 TABLET BY MOUTH ONCE DAILY AS NEEDED FOR  ANXIETY.  DO  NOT  TAKE  EVERY  DAY  Pre-diabetes -     metFORMIN (GLUCOPHAGE) 500 MG tablet; Take 1 tablet (500 mg total) by mouth 2 (two) times daily with a meal. TAKE 1 TABLET BY MOUTH TWICE DAILY WITH A MEAL. Schedule appointment for additional refills. -     Hemoglobin A1c -     Microalbumin, urine  Dyslipidemia -     Lipid panel -     pravastatin (PRAVACHOL) 20 MG tablet; Take 1 tablet (20 mg total) by mouth daily.  Body mass index (BMI) of 36.0-36.9 in adult    Patient Instructions       If you have lab work done today you will be contacted with your lab results within the next 2 weeks.  If you have not heard from us then please contact us. The fastest way to get your results is to register for My Chart.   IF you received an x-ray today, you will receive an invoice from [facility]. Please contact [facility] at [phone] with questions or concerns regarding your invoice.   IF you  received labwork today, you will receive an invoice from [facility]. Please contact [facility] at [phone] with questions or concerns regarding your invoice.   Our billing staff will not be able to assist you with questions regarding bills from these companies.  You will be contacted with the lab results as soon as they are available. The fastest way to get your results is to activate your My Chart account. Instructions are located on the last page of this paperwork. If you have not heard from us regarding the results in 2 weeks, please contact this office.      Hipertensin en los adultos Hypertension, Adult El trmino hipertensin es otra forma de denominar a la presin arterial elevada. La presin arterial elevada fuerza al corazn a trabajar ms para bombear la sangre. Esto puede causar problemas con el paso del Metcalfetiempo. Una lectura de presin arterial est compuesta por 2 nmeros. Hay un nmero superior (sistlico) sobre un nmero inferior (diastlico). Lo ideal es tener la presin arterial por debajo de 120/80. Las elecciones saludables pueden ayudar a Personal assistantbajar la presin arterial, o tal vez necesite medicamentos para bajarla. Cules son las causas? Se desconoce la causa de esta afeccin. Algunas afecciones pueden estar relacionadas con la presin arterial alta. Qu incrementa el riesgo?  Fumar.  Tener diabetes mellitus tipo 2, colesterol alto, o ambos.  No hacer la cantidad suficiente de actividad fsica o ejercicio.  Tener sobrepeso.  Consumir mucha grasa, azcar, caloras o sal (sodio) en su dieta.  Beber alcohol en exceso.  Tener una enfermedad renal a largo plazo (crnica).  Tener antecedentes familiares de presin arterial alta.  Edad. Los riesgos aumentan con la  edad.  Trevor Mace. El riesgo es mayor para las Statistician.  Sexo. Antes de los 45aos, los hombres corren ms [employer]. Despus de los 65aos, las mujeres corren ms [employer].  Tener apnea obstructiva del sueo.  Estrs. Cules son los signos o los sntomas?  Es posible que la presin arterial alta puede no cause sntomas. La presin arterial muy alta (crisis hipertensiva) puede provocar: ? Dolor de [place]. ? Sensaciones de preocupacin o nerviosismo (ansiedad). ? Falta de aire. ? Hemorragia nasal. ? Sensacin de Journalist, newspaper (nuseas). ? Vmitos. ? Cambios en la forma de ver. ? Dolor muy intenso en el pecho. ? Convulsiones. Cmo se trata?  Esta afeccin se trata haciendo cambios saludables en el estilo de vida, por ejemplo: ? Consumir alimentos saludables. ? Hacer ms ejercicio. ? Beber menos alcohol.  El mdico puede recetarle medicamentos si los cambios en el estilo de vida no son suficientes para Museum/gallery curator la presin arterial y si: ? El nmero de arriba est por encima de 130. ? El nmero de abajo est por encima de 80.  Su presin arterial personal ideal puede variar. Siga estas instrucciones en su casa: Comida y bebida   Si se lo dicen, siga el plan de alimentacin de DASH (Dietary Approaches to Stop Hypertension, Maneras de alimentarse para detener la hipertensin). Para seguir este plan: ? Llene la mitad del plato de cada comida con frutas y verduras. ? Llene un cuarto del plato de cada comida con cereales integrales. Los cereales integrales incluyen pasta integral, arroz integral y pan integral. ? Coma y beba productos lcteos con bajo contenido de grasa, como leche descremada o yogur bajo en grasas. ? Llene un cuarto del plato de cada comida con protenas bajas en grasa (magras). Las protenas bajas en grasa incluyen pescado, pollo sin piel, huevos, frijoles y tofu. ? Evite consumir carne grasa, carne curada y procesada, o pollo con piel. ? Evite consumir alimentos prehechos o procesados.  Consuma menos de 1500 mg de sal por da.  No beba alcohol si: ? El mdico le indica que no lo haga. ? Est embarazada,  puede estar embarazada o est tratando de quedar embarazada.  Si bebe alcohol: ? Limite la cantidad que bebe a lo siguiente:  De 0 a 1 medida por da para las mujeres.  De 0 a 2 medidas por da para los hombres. ? Est atento a la cantidad de alcohol que hay en las bebidas que toma. En los [place], una medida equivale a una botella de cerveza de 12oz ( ), un vaso de vino de 5oz ( ) o un vaso de una bebida alcohlica de alta graduacin de 1oz (50ml). Estilo de vida   Trabaje con su mdico para mantenerse en un peso saludable o para perder peso. Pregntele a su mdico cul es el peso recomendable para usted.  Haga al menos de ejercicio la [employer] de la [place]. Estos pueden incluir caminar, nadar o andar en bicicleta.  Realice al menos 30 minutos de ejercicio que fortalezca sus msculos (ejercicios de resistencia) al menos 3 das a la [place]. Estos pueden incluir levantar pesas o hacer Pilates.  No consuma ningn producto que contenga nicotina o tabaco, como cigarrillos, cigarrillos electrnicos y tabaco de Theatre manager. Si necesita ayuda para dejar de fumar, consulte al [employer].  Controle su presin arterial en su casa tal como le indic el mdico.  Concurra a todas las visitas de seguimiento como  se lo haya indicado el mdico. Esto es importante. Medicamentos  [employer] de venta libre y los recetados solamente como se lo haya indicado el mdico. Siga cuidadosamente las indicaciones.  No omita las dosis de medicamentos para la presin arterial. Los medicamentos pierden eficacia si omite dosis. El hecho de omitir las dosis tambin [place] el riesgo de otros problemas.  Pregntele a su mdico a qu efectos secundarios o reacciones a los Museum/gallery curator. Comunquese con un mdico si:  Piensa que tiene [place] reaccin a los medicamentos que est tomando.  Tiene dolores de cabeza frecuentes (recurrentes).  Se siente  mareado.  Tiene hinchazn en los tobillos.  Tiene problemas de visin. Solicite ayuda inmediatamente si:  Siente un dolor de cabeza muy intenso.  Empieza a sentirse desorientado (confundido).  Se siente dbil o adormecido.  Siente que va a desmayarse.  Tiene un dolor muy intenso en las siguientes zonas: ? Pecho. ? Vientre (abdomen).  Vomita ms de una vez.  Tiene dificultad para respirar. Resumen  El trmino hipertensin es otra forma de denominar a la presin arterial elevada.  La presin arterial elevada fuerza al corazn a trabajar ms para bombear la sangre.  Para la [employer], una presin arterial normal es menor que 120/80.  Las decisiones saludables pueden ayudarle a disminuir su presin arterial. Si no puede bajar su presin arterial mediante decisiones saludables, es posible que deba tomar medicamentos. Esta informacin no tiene Theme park manager el consejo del mdico. Asegrese de hacerle al mdico cualquier pregunta que tenga. Document Revised: 06/01/2018 Document Reviewed: 06/01/2018 Elsevier Patient Education  2020 Elsevier Inc.      Edwina Barth, MD [facility]

## 2020-05-02 LAB — HEMOGLOBIN A1C
Est. average glucose Bld gHb Est-mCnc: 117 mg/dL
Hgb A1c MFr Bld: 5.7 % — ABNORMAL HIGH (ref 4.8–5.6)

## 2020-05-02 LAB — MICROALBUMIN, URINE: Microalbumin, Urine: 545.2 ug/mL

## 2020-05-03 ENCOUNTER — Other Ambulatory Visit: Payer: Self-pay | Admitting: Emergency Medicine

## 2020-05-03 DIAGNOSIS — R809 Proteinuria, unspecified: Secondary | ICD-10-CM

## 2020-05-03 MED ORDER — LISINOPRIL 5 MG PO TABS: 5.0000 mg | ORAL_TABLET | Freq: Every day | ORAL | 3 refills | Status: AC

## 2020-05-07 ENCOUNTER — Telehealth: Payer: Self-pay | Admitting: Emergency Medicine

## 2020-05-07 NOTE — Telephone Encounter (Signed)
Spoke with the patient, he would like clarification about if he has to take both amlodipine and lisinopril for BP . He does not check BP readings at home . Please advise

## 2020-05-07 NOTE — Telephone Encounter (Signed)
Pt is wondering why he was prescribed lisinopril (ZESTRIL) 5 MG tablet [[id number]] on 05/03/20.  He doesn't understand why he is on it. Please advise at [phone].

## 2020-05-07 NOTE — Telephone Encounter (Signed)
Yes he has to take both.  The lisinopril is low-dose and more for kidney protection.  He is leaking protein into his urine due to diabetes.  We are trying to prevent diabetic nephropathy by using an ACE inhibitor.  Thanks.

## 2020-05-07 NOTE — Telephone Encounter (Signed)
Patient has been informed and understands that he should go ahead and take both lisinopril and amlodipine for his chronic conditions.

## 2020-07-29 ENCOUNTER — Telehealth: Payer: Self-pay | Admitting: Emergency Medicine

## 2020-07-29 NOTE — Telephone Encounter (Signed)
Was still on line with patient and the call was dropped / scheduled in office appt with his provider 1st avail for tomorrow / pt is wanting to just come in to talk with his provider . I did advise against this / was unable to confirm with pt if appt tomorrow would work / will continue to try to reach out to patient

## 2020-07-29 NOTE — Telephone Encounter (Signed)
Pt is calling to speak to Provider to talk about Kidneys    [phone]

## 2020-07-30 ENCOUNTER — Encounter: Payer: Self-pay | Admitting: Emergency Medicine

## 2020-07-30 ENCOUNTER — Other Ambulatory Visit: Payer: Self-pay

## 2020-07-30 ENCOUNTER — Ambulatory Visit (INDEPENDENT_AMBULATORY_CARE_PROVIDER_SITE_OTHER): Payer: Self-pay | Admitting: Emergency Medicine

## 2020-07-30 VITALS — BP 158/84 | HR 84 | Temp 99.1°F | Resp 16 | Ht 62.21 in | Wt 211.0 lb

## 2020-07-30 DIAGNOSIS — I10 Essential (primary) hypertension: Secondary | ICD-10-CM

## 2020-07-30 DIAGNOSIS — E785 Hyperlipidemia, unspecified: Secondary | ICD-10-CM

## 2020-07-30 DIAGNOSIS — E1129 Type 2 diabetes mellitus with other diabetic kidney complication: Secondary | ICD-10-CM

## 2020-07-30 DIAGNOSIS — F411 Generalized anxiety disorder: Secondary | ICD-10-CM

## 2020-07-30 DIAGNOSIS — R7303 Prediabetes: Secondary | ICD-10-CM

## 2020-07-30 DIAGNOSIS — M549 Dorsalgia, unspecified: Secondary | ICD-10-CM

## 2020-07-30 DIAGNOSIS — R809 Proteinuria, unspecified: Secondary | ICD-10-CM

## 2020-07-30 LAB — POCT URINALYSIS DIP (MANUAL ENTRY)
Bilirubin, UA: NEGATIVE
Glucose, UA: NEGATIVE mg/dL
Ketones, POC UA: NEGATIVE mg/dL
Leukocytes, UA: NEGATIVE
Nitrite, UA: NEGATIVE
Protein Ur, POC: NEGATIVE mg/dL
Spec Grav, UA: 1.015 (ref 1.010–1.025)
Urobilinogen, UA: 1 E.U./dL
pH, UA: 7 (ref 5.0–8.0)

## 2020-07-30 MED ORDER — CLONAZEPAM 1 MG PO TABS: ORAL_TABLET | ORAL | 1 refills | Status: AC

## 2020-07-30 MED ORDER — METFORMIN HCL 500 MG PO TABS: 500.0000 mg | ORAL_TABLET | Freq: Two times a day (BID) | ORAL | 3 refills | Status: DC

## 2020-07-30 NOTE — Progress Notes (Signed)
Cameron Haney 49 y.o.   Chief Complaint  Patient presents with  . Flank Pain    per patient the kidneys since last month and the pain is much stronger    HISTORY OF PRESENT ILLNESS: This is a 49 y.o. male with history of diabetes and hypertension here for follow-up. Last visit with me on 05/01/2020. Presently on amlodipine and carvedilol. Recent urine test showed microalbuminuria.  Started on low-dose lisinopril 5 mg daily. Normal kidney function test with normal GFR.  HPI   Prior to Admission medications   Medication Sig Start Date End Date Taking? Authorizing Provider  amLODipine (NORVASC) 5 MG tablet Take 1 tablet (5 mg total) by mouth daily. 05/01/20 07/30/20 Yes Laniece Hornbaker, Eilleen Kempf, MD  carvedilol (COREG) 25 MG tablet TAKE 1 TABLET BY MOUTH TWICE DAILY WITH A MEAL 05/01/20  Yes Cyril Railey, Eilleen Kempf, MD  clonazePAM (KLONOPIN) 1 MG tablet TAKE 1 TABLET BY MOUTH ONCE DAILY AS NEEDED FOR  ANXIETY.  DO  NOT  TAKE  EVERY  DAY 05/01/20  Yes Charlaine Utsey, Eilleen Kempf, MD  lisinopril (ZESTRIL) 5 MG tablet Take 1 tablet (5 mg total) by mouth daily. 05/03/20  Yes Elayne Gruver, Eilleen Kempf, MD  metFORMIN (GLUCOPHAGE) 500 MG tablet Take 1 tablet (500 mg total) by mouth 2 (two) times daily with a meal. TAKE 1 TABLET BY MOUTH TWICE DAILY WITH A MEAL. Schedule appointment for additional refills. 05/01/20 07/30/20 Yes Ellyanna Holton, Eilleen Kempf, MD  pravastatin (PRAVACHOL) 20 MG tablet Take 1 tablet (20 mg total) by mouth daily. 05/01/20  Yes Amor Packard, Eilleen Kempf, MD  PARoxetine (PAXIL) 20 MG tablet Take 1 tablet (20 mg total) by mouth daily. 08/28/18 11/26/18  Georgina Quint, MD    Allergies  Allergen Reactions  . Aspirin Other (See Comments)    Dizzy   . Other Other (See Comments)    Neurobion Forte: Skin "fell off"    Patient Active Problem List   Diagnosis Date Noted  . Situational anxiety 08/28/2018  . CAD (coronary artery disease) 01/04/2018  . Pre-diabetes 11/24/2016  . Dyslipidemia  11/24/2016  . GAD (generalized anxiety disorder) 11/01/2016  . Essential hypertension 01/29/2016  . Hypertensive urgency 01/29/2016    Past Medical History:  Diagnosis Date  . Anxiety   . Diabetes mellitus without complication (HCC)   . High cholesterol   . Hypertension     Past Surgical History:  Procedure Laterality Date  . CARDIAC CATHETERIZATION  2016   Most likely procedure, based on his description. Not long after arriving here. They told him nothing was wrong with his heart.    Social History   Socioeconomic History  . Marital status: Married    Spouse name: Lauris Poag  . Number of children: 3  . Years of education: 34  . Highest education level: Not on file  Occupational History  . Occupation: [employer] rest[employer]ant  Tobacco Use  . Smoking status: Former Smoker    Quit date: 09/03/2001    Years since quitting: 18.9  . Smokeless tobacco: Never Used  Substance and Sexual Activity  . Alcohol use: Yes    Comment: 3 Bud Light beers daily,   . Drug use: No  . Sexual activity: Not on file  Other Topics Concern  . 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  Difficulty of Paying Living Expenses: Not on file  Food Insecurity:   . Worried About Programme researcher, broadcasting/film/video in the Last Year: Not on file  . Ran Out of Food in the Last Year: Not on file  Transportation Needs:   . Lack of Transportation (Medical): Not on file  . Lack of Transportation (Non-Medical): Not on file  Physical Activity:   . Days of Exercise per Week: Not on file  . Minutes of Exercise per Session: Not on file  Stress:   . Feeling of Stress : Not on file  Social Connections:   . Frequency of Communication with Friends and Family: Not on file  . Frequency of Social Gatherings with Friends and Family: Not on file  . Attends Religious Services: Not on  file  . Active Member of Clubs or Organizations: Not on file  . Attends Banker Meetings: Not on file  . Marital Status: Not on file  Intimate Partner Violence:   . Fear of Current or Ex-Partner: Not on file  . Emotionally Abused: Not on file  . Physically Abused: Not on file  . Sexually Abused: Not on file    Family History  Problem Relation Age of Onset  . Diabetes Mother 59  . Stroke Father 46  . Heart disease Neg Hx      Review of Systems  Constitutional: Negative.  Negative for chills and fever.  HENT: Negative.  Negative for congestion and sore throat.   Respiratory: Negative.  Negative for cough and shortness of breath.   Cardiovascular: Negative.  Negative for chest pain and palpitations.  Gastrointestinal: Negative.  Negative for abdominal pain, diarrhea, nausea and vomiting.  Genitourinary: Negative.  Negative for dysuria, flank pain, frequency, hematuria and urgency.  Musculoskeletal: Positive for back pain.  Skin: Negative.  Negative for rash.  Neurological: Negative.  Negative for dizziness and headaches.  All other systems reviewed and are negative.   Today's Vitals   07/30/20 1327  BP: (!) 158/84  Pulse: 84  Resp: 16  Temp: 99.1 F (37.3 C)  TempSrc: Temporal  SpO2: 95%  Weight: 211 lb (95.7 kg)  Height: 5' 2.21" (1.58 m)   Body mass index is 38.34 kg/m. Wt Readings from Last 3 Encounters:  07/30/20 211 lb (95.7 kg)  05/01/20 206 lb (93.4 kg)  08/28/18 214 lb 3.2 oz (97.2 kg)    Physical Exam Vitals reviewed.  Constitutional:      Appearance: Normal appearance.  HENT:     Head: Normocephalic.  Eyes:     Extraocular Movements: Extraocular movements intact.     Conjunctiva/sclera: Conjunctivae normal.     Pupils: Pupils are equal, round, and reactive to light.  Cardiovascular:     Rate and Rhythm: Normal rate and regular rhythm.     Pulses: Normal pulses.     Heart sounds: Normal heart sounds.  Pulmonary:     Effort:  Pulmonary effort is normal.     Breath sounds: Normal breath sounds.  Abdominal:     General: Bowel sounds are normal. There is no distension.     Palpations: Abdomen is soft.     Tenderness: There is no abdominal tenderness. There is no right CVA tenderness or left CVA tenderness.  Musculoskeletal:        General: Normal range of motion.     Cervical back: Normal range of motion.  Skin:    General: Skin is warm and dry.     Capillary Refill: Capillary  refill takes less than 2 seconds.  Neurological:     Mental Status: He is alert and oriented to person, place, and time.  Psychiatric:        Mood and Affect: Mood normal.        Behavior: Behavior normal.      Results for orders placed or performed in visit on 07/30/20 (from the past 24 hour(s))  POCT urinalysis dipstick     Status: Abnormal   Collection Time: 07/30/20  1:55 PM  Result Value Ref Range   Color, UA yellow yellow   Clarity, UA clear clear   Glucose, UA negative negative mg/dL   Bilirubin, UA negative negative   Ketones, POC UA negative negative mg/dL   Spec Grav, UA 1.610 9.604 - 1.025   Blood, UA trace-lysed (A) negative   pH, UA 7.0 5.0 - 8.0   Protein Ur, POC negative negative mg/dL   Urobilinogen, UA 1.0 0.2 or 1.0 E.U./dL   Nitrite, UA Negative Negative   Leukocytes, UA Negative Negative     ASSESSMENT & PLAN: Clinically stable.  No medical concerns identified during this visit. Continue present medications.  No changes. Follow-up in 6 months. Velma was seen today for flank pain and medication refill.  Diagnoses and all orders for this visit:  Essential hypertension -     Comprehensive metabolic panel -     CBC with Differential/Platelet  GAD (generalized anxiety disorder) -     clonazePAM (KLONOPIN) 1 MG tablet; TAKE 1 TABLET BY MOUTH ONCE DAILY AS NEEDED FOR  ANXIETY.  DO  NOT  TAKE  EVERY  DAY  Pre-diabetes -     metFORMIN (GLUCOPHAGE) 500 MG tablet; Take 1 tablet (500 mg total) by mouth  2 (two) times daily with a meal. TAKE 1 TABLET BY MOUTH TWICE DAILY WITH A MEAL.  Back pain, unspecified back location, unspecified back pain laterality, unspecified chronicity -     POCT urinalysis dipstick  Microalbuminuria due to type 2 diabetes mellitus (HCC) -     Microalbumin, urine  Dyslipidemia    Patient Instructions       If you have lab work done today you will be contacted with your lab results within the next 2 weeks.  If you have not heard from [place] then please contact us. The fastest way to get your results is to register for My Chart.   IF you received an x-ray today, you will receive an invoice from [facility]. Please contact [facility] at [phone] with questions or concerns regarding your invoice.   IF you received labwork today, you will receive an invoice from [facility]. Please contact [facility] at [phone] with questions or concerns regarding your invoice.   Our billing staff will not be able to assist you with questions regarding bills from these companies.  You will be contacted with the lab results as soon as they are available. The fastest way to get your results is to activate your My Chart account. Instructions are located on the last page of this paperwork. If you have not heard from [place] regarding the results in 2 weeks, please contact this office.     Plan de alimentacin DASH DASH Eating Plan DASH es la sigla en ingls de "Enfoques Alimentarios para Detener la Hipertensin" (Dietary Approaches to Stop Hypertension). El plan de alimentacin DASH ha demostrado bajar la presin arterial elevada (hipertensin). Tambin puede reducir Lexmark International de diabetes tipo 2, enfermedad cardaca y accidente cerebrovascular. Este plan [employer]  ayudar a Geophysical data processor. Consejos para seguir este plan  Pautas generales  Evite ingerir ms de 2,300 mg (miligramos) de sal (sodio) por da. Si tiene hipertensin, es posible que necesite reducir la  ingesta de sodio a 1,500 mg por da.  Limite el consumo de alcohol a no ms de por da si es mujer y no est [place], y por da si es hombre. Una medida equivale a 12oz ( ) de cerveza, 5oz ( ) de vino o 1oz (13ml) de bebidas alcohlicas de alta graduacin.  Trabaje con su mdico para mantener un peso saludable o perder [employer]. Pregntele cul es el peso recomendado para usted.  Realice al menos 30 minutos de ejercicio que haga que se acelere su corazn (ejercicio Magazine features editor) la DIRECTV de la [place]. Estas actividades pueden incluir caminar, nadar o andar en bicicleta.  Trabaje con su mdico o especialista en alimentacin y nutricin (nutricionista) para ajustar su plan alimentario a sus necesidades calricas personales. Lectura de las etiquetas de los alimentos   Verifique en las etiquetas de los alimentos, la cantidad de sodio por porcin. Elija alimentos con menos del 5 por ciento del valor diario de sodio. Generalmente, los alimentos con menos de 300 mg de sodio por porcin se encuadran dentro de este plan alimentario.  Para encontrar cereales integrales, busque la palabra "integral" como primera palabra en la lista de ingredientes. De compras  Compre productos en los que en su etiqueta diga: "bajo contenido de sodio" o "sin agregado de sal".  Compre alimentos frescos. Evite los alimentos enlatados y comidas precocidas o congeladas. Coccin  Evite agregar sal cuando cocine. Use hierbas o aderezos sin sal, en lugar de sal de mesa o sal marina. Consulte al mdico o farmacutico antes de usar sustitutos de la sal.  No fra los alimentos. A la hora de cocinar los alimentos opte por hornearlos, hervirlos, grillarlos y asarlos a Patent attorney.  Cocine con aceites cardiosaludables, como oliva, canola, soja o girasol. Planificacin de las comidas  Consuma una dieta equilibrada, que incluya lo siguiente: ? 5o ms porciones de frutas y Hydrologist. Trate de que la mitad del plato de cada comida sean frutas y verduras. ? Hasta 6 u 8 porciones de cereales integrales por da. ? Menos de 6 onzas de carne, aves o pescado Copy. Una porcin de 3 onzas de carne tiene casi el mismo tamao que un mazo de cartas. Un huevo equivale a 1 onza. ? Dos porciones de productos lcteos descremados por Futures trader. ? Una porcin de frutos secos, semillas o frijoles 5 veces por semana. ? Grasas cardiosaludables. Las grasas saludables llamadas cidos grasos omega-3 se encuentran en alimentos como semillas de lino y pescados de agua fra, como por ejemplo, sardinas, salmn y caballa.  Limite la cantidad que ingiere de los siguientes alimentos: ? Alimentos enlatados o envasados. ? Alimentos con alto contenido de grasa trans, como alimentos fritos. ? Alimentos con alto contenido de grasa saturada, como carne con grasa. ? Dulces, postres, bebidas azucaradas y otros alimentos con azcar agregada. ? Productos lcteos enteros.  No le agregue sal a los alimentos antes de probarlos.  Trate de comer al menos 2 comidas vegetarianas por semana.  Consuma ms comida casera y menos de restaurante, de bufs y comida rpida.  Cuando coma en un restaurante, pida que preparen su comida con menos sal o, en lo posible, sin nada de sal. Qu alimentos se recomiendan? Los alimentos enumerados a continuacin no constituyen Physiological scientist  lista completa. Hable con el nutricionista sobre las mejores opciones alimenticias para usted. Cereales Pan de salvado o integral. Pasta de salvado o integral. Arroz integral. Avena. Quinua. Trigo burgol. Cereales integrales y con bajo contenido de [place]. Pan pita. Galletitas de [place] con bajo contenido de [place]the [place] deg [place] y [place]. Tortillas de [place] integral. Verduras Verduras frescas o congeladas (crudas, al vapor, asadas o grilladas). Jugos de tomate y verduras con bajo contenido de sodio o reducidos en sodio. Salsa y pasta de tomate con bajo contenido de  sodio o reducidas en sodio. Verduras enlatadas con bajo contenido de sodio o reducidas en sodio. Frutas Todas las frutas frescas, congeladas o disecadas. Frutas enlatadas en jugo natural (sin agregado de azcar). Carne y otros alimentos proteicos Pollo o pavo sin piel. Carne de pollo o de [place]. Cerdo desgrasado. Pescado y [employer]. Claras de huevo. Porotos, guisantes o lentejas secos. Frutos secos, mantequilla de frutos secos y semillas sin sal. Frijoles enlatados sin sal. Cortes de carne vacuna magra, desgrasada. Embutidos magros, con bajo contenido de [place]. Lcteos Leche descremada (1%) o descremada. Quesos sin grasa, con bajo contenido de grasa o descremados. Queso blanco o ricota sin grasa, con bajo contenido de [place]. Yogur semidescremado o descremado. Queso con bajo contenido de [place]) y [place]. Grasas y [employer] untables que no contengan grasas trans. Aceite vegetal. Jerolyn Shin y aderezos para ensaladas livianos o con bajo contenido de grasas (reducidos en sodio). Aceite de canola, crtamo, oliva, soja y [place]. [place]. Condimentos y otros alimentos Hierbas. Especias. Mezclas de condimentos sin sal. Palomitas de maz y pretzels sin sal. Dulces con bajo contenido de grasas. Qu alimentos no se recomiendan? Los alimentos enumerados a continuacin no constituyen Water quality scientist. Hable con el nutricionista sobre las mejores opciones alimenticias para usted. Cereales Productos de panificacin hechos con grasa, como medialunas, magdalenas y algunos panes. Comidas con arroz o pasta seca listas para usar. Verduras Verduras con crema o fritas. Verduras en salsa de [place]. Verduras enlatadas regulares (que no sean con bajo contenido de sodio o reducidas en sodio). Pasta y salsa de tomates enlatadas regulares (que no sean con bajo contenido de sodio o reducidas en sodio). Jugos de tomate y verduras regulares (que no sean con bajo contenido de sodio o reducidos en sodio). Pepinillos.  Aceitunas. Nils Pyle Fruta enlatada en almbar liviano o espeso. Frutas cocidas en aceite. Frutas con salsa de crema o Pope[place]. Carne y otros alimentos proteicos Cortes de carne con grasa. Costillas. Carne frita. [place]. Salchichas. Mortadela y otras carnes procesadas. Salame. Panceta. Perros calientes (hotdogs). Salchicha de cerdo. Frutos secos y semillas con sal. Frijoles enlatados con agregado de sal. Pescado enlatado o ahumado. Huevos enteros o yemas. Pollo o pavo con piel. Lcteos Leche entera o al 2%, crema y [address] y mitad crema. Queso crema entero o con toda su grasa. Yogur entero o endulzado. Quesos con toda su grasa. Sustitutos de cremas no lcteas. Coberturas batidas. Quesos para untar y quesos procesados. Grasas y [employer]. Margarina en barra. Manteca de cerdo. Materia grasa. Mantequilla clarificada. Grasa de panceta. Aceites tropicales como aceite de coco, palmiste o palma. Condimentos y otros alimentos Palomitas de maz y pretzels con sal. Sal de cebolla, sal de ajo, sal condimentada, sal de mesa y sal marina. Salsa Worcestershire. Salsa trtara. Salsa barbacoa. Salsa teriyaki. Salsa de soja, incluso la que tiene contenido reducido de [place]. Salsa de carne. Salsas en lata y envasadas. Salsa de pescado. Salsa de [place]. Salsa rosada. Rbano picante envasado. [place]. Mostaza. Saborizantes y  tiernizantes para carne. Caldo en cubitos. Salsa picante y salsa tabasco. Escabeches envasados o ya preparados. Aderezos para tacos prefabricados o envasados. Salsas. Aderezos comunes para ensalada. Dnde encontrar ms informacin:  The [employer] del [address], los Pulmones y Risk manager (National Heart, Lung, and Blood Institute): PopSteam.is  Asociacin Estadounidense del Corazn (American Heart Association): www.heart.org Resumen  El plan de alimentacin DASH ha demostrado bajar la presin arterial elevada (hipertensin). Tambin puede reducir Lexmark International de diabetes tipo 2,  enfermedad cardaca y accidente cerebrovascular.  Con el plan de alimentacin DASH, deber limitar el consumo de sal (sodio) a 2,300 mg por da. Si tiene hipertensin, es posible que necesite reducir la ingesta de sodio a 1,500 mg por da.  Cuando siga el plan de alimentacin DASH, trate de comer ms frutas frescas y verduras, cereales integrales, carnes magras, lcteos descremados y grasas cardiosaludables.  Trabaje con su mdico o especialista en alimentacin y nutricin (nutricionista) para ajustar su plan alimentario a sus necesidades calricas personales. Esta informacin no tiene Theme park manager el consejo del mdico. Asegrese de hacerle al mdico cualquier pregunta que tenga. Document Revised: 12/06/2016 Document Reviewed: 12/06/2016 Elsevier Patient Education  2020 Elsevier Inc.  Hipertensin en los adultos Hypertension, Adult El trmino hipertensin es otra forma de denominar a la presin arterial elevada. La presin arterial elevada fuerza al corazn a trabajar ms para bombear la sangre. Esto puede causar problemas con el paso del [place]. Una lectura de presin arterial est compuesta por 2 nmeros. Hay un nmero superior (sistlico) sobre un nmero inferior (diastlico). Lo ideal es tener la presin arterial por debajo de 120/80. Las elecciones saludables pueden ayudar a Personal assistant presin arterial, o tal vez necesite medicamentos para bajarla. Cules son las causas? Se desconoce la causa de esta afeccin. Algunas afecciones pueden estar relacionadas con la presin arterial alta. Qu incrementa el riesgo?  Fumar.  Tener diabetes mellitus tipo 2, colesterol alto, o ambos.  No hacer la cantidad suficiente de actividad fsica o ejercicio.  Tener sobrepeso.  Consumir mucha grasa, azcar, caloras o sal (sodio) en su dieta.  Beber alcohol en exceso.  Tener una enfermedad renal a largo plazo (crnica).  Tener antecedentes familiares de presin arterial alta.  Edad. Los  riesgos aumentan con la edad.  Raza. El riesgo es mayor para las Statistician.  Sexo. Antes de los 45aos, los hombres corren ms [employer]. Despus de los 65aos, las mujeres corren ms Lex[employer] International.  Tener apnea obstructiva del sueo.  Estrs. Cules son los signos o los sntomas?  Es posible que la presin arterial alta puede no cause sntomas. La presin arterial muy alta (crisis hipertensiva) puede provocar: ? Dolor de [place]. ? Sensaciones de preocupacin o nerviosismo (ansiedad). ? Falta de aire. ? Hemorragia nasal. ? Sensacin de Journalist, newspaper (nuseas). ? Vmitos. ? Cambios en la forma de ver. ? Dolor muy intenso en el pecho. ? Convulsiones. Cmo se trata?  Esta afeccin se trata haciendo cambios saludables en el estilo de vida, por ejemplo: ? Consumir alimentos saludables. ? Hacer ms ejercicio. ? Beber menos alcohol.  El mdico puede recetarle medicamentos si los cambios en el estilo de vida no son suficientes para Museum/gallery curator la presin arterial y si: ? El nmero de arriba est por encima de 130. ? El nmero de abajo est por encima de 80.  Su presin arterial personal ideal puede variar. Siga estas instrucciones en su casa: Comida y bebida   Si se  lo dicen, siga el plan de alimentacin de DASH (Dietary Approaches to Stop Hypertension, Maneras de alimentarse para detener la hipertensin). Para seguir este plan: ? Llene la mitad del plato de cada comida con frutas y verduras. ? Llene un cuarto del plato de cada comida con cereales integrales. Los cereales integrales incluyen pasta integral, arroz integral y pan integral. ? Coma y beba productos lcteos con bajo contenido de grasa, como leche descremada o yogur bajo en grasas. ? Llene un cuarto del plato de cada comida con protenas bajas en grasa (magras). Las protenas bajas en grasa incluyen pescado, pollo sin piel, huevos, frijoles y tofu. ? Evite consumir  carne grasa, carne curada y procesada, o pollo con piel. ? Evite consumir alimentos prehechos o procesados.  Consuma menos de 1500 mg de sal por da.  No beba alcohol si: ? El mdico le indica que no lo haga. ? Est embarazada, puede estar embarazada o est tratando de quedar embarazada.  Si bebe alcohol: ? Limite la cantidad que bebe a lo siguiente:  De 0 a 1 medida por da para las mujeres.  De 0 a 2 medidas por da para los hombres. ? Est atento a la cantidad de alcohol que hay en las bebidas que toma. En los [place], una medida equivale a una botella de cerveza de 12oz (355ml), un vaso de vino de 5oz (148ml) o un vaso de una bebida alcohlica de alta graduacin de 1oz (44ml). Estilo de vida   Trabaje con su mdico para mantenerse en un peso saludable o para perder peso. Pregntele a su mdico cul es el peso recomendable para usted.  Haga al menos 30minutos de ejercicio la DIRECTVmayora de los das de la [place]angerv[place]emana. Estos pueden incluir caminar, nadar o andar en bicicleta.  Realice al menos 30 minutos de ejercicio que fortalezca sus msculos (ejercicios de resistencia) al menos 3 das a la Chena Ridgesemana. Estos pueden incluir levantar pesas o hacer Pilates.  No consuma ningn producto que contenga nicotina o tabaco, como cigarrillos, cigarrillos electrnicos y tabaco de Theatre managermascar. Si necesita ayuda para dejar de fumar, consulte al American Expressmdico.  Controle su presin arterial en su casa tal como le indic el mdico.  Concurra a todas las visitas de seguimiento como se lo haya indicado el mdico. Esto es importante. Medicamentos  Baxter Internationalome los medicamentos de venta libre y los recetados solamente como se lo haya indicado el mdico. Siga cuidadosamente las indicaciones.  No omita las dosis de medicamentos para la presin arterial. Los medicamentos pierden eficacia si omite dosis. El hecho de omitir las dosis tambin Lesothoaumenta el riesgo de otros problemas.  Pregntele a su mdico a qu efectos  secundarios o reacciones a los Museum/gallery curatormedicamentos debe prestar atencin. Comunquese con un mdico si:  Piensa que tiene [place] reaccin a los medicamentos que est tomando.  Tiene dolores de cabeza frecuentes (recurrentes).  Se siente mareado.  Tiene hinchazn en los tobillos.  Tiene problemas de visin. Solicite ayuda inmediatamente si:  Siente un dolor de cabeza muy intenso.  Empieza a sentirse desorientado (confundido).  Se siente dbil o adormecido.  Siente que va a desmayarse.  Tiene un dolor muy intenso en las siguientes zonas: ? Pecho. ? Vientre (abdomen).  Vomita ms de una vez.  Tiene dificultad para respirar. Resumen  El trmino hipertensin es otra forma de denominar a la presin arterial elevada.  La presin arterial elevada fuerza al corazn a trabajar ms para bombear la sangre.  Para la Franklin Resourcesmayora de las personas, [place]una  presin arterial normal es menor que 120/80.  Las decisiones saludables pueden ayudarle a disminuir su presin arterial. Si no puede bajar su presin arterial mediante decisiones saludables, es posible que deba tomar medicamentos. Esta informacin no tiene Theme park manager el consejo del mdico. Asegrese de hacerle al mdico cualquier pregunta que tenga. Document Revised: 06/01/2018 Document Reviewed: 06/01/2018 Elsevier Patient Education  2020 Elsevier Inc.      Edwina Barth, MD [facility]

## 2020-07-30 NOTE — Patient Instructions (Addendum)
   If you have lab work done today you will be contacted with your lab results within the next 2 weeks.  If you have not heard from us then please contact us. The fastest way to get your results is to register for My Chart.   IF you received an x-ray today, you will receive an invoice from [facility]. Please contact  Radiology at [phone] with questions or concerns regarding your invoice.   IF you received labwork today, you will receive an invoice from [facility]. Please contact [facility] at [phone] with questions or concerns regarding your invoice.   Our billing staff will not be able to assist you with questions regarding bills from these companies.  You will be contacted with the lab results as soon as they are available. The fastest way to get your results is to activate your My Chart account. Instructions are located on the last page of this paperwork. If you have not heard from us regarding the results in 2 weeks, please contact this office.     Plan de alimentacin DASH DASH Eating Plan DASH es la sigla en ingls de "Enfoques Alimentarios para Detener la Hipertensin" (Dietary Approaches to Stop Hypertension). El plan de alimentacin DASH ha demostrado bajar la presin arterial elevada (hipertensin). Tambin puede reducir el riesgo de diabetes tipo 2, enfermedad cardaca y accidente cerebrovascular. Este plan tambin puede ayudar a adelgazar. Consejos para seguir este plan  Pautas generales  Evite ingerir ms de 2,300 mg (miligramos) de sal (sodio) por da. Si tiene hipertensin, es posible que necesite reducir la ingesta de sodio a 1,500 mg por da.  Limite el consumo de alcohol a no ms de 1medida por da si es mujer y no est embarazada, y 2medidas por da si es hombre. Una medida equivale a 12oz (355ml) de cerveza, 5oz (148ml) de vino o 1oz (44ml) de bebidas alcohlicas de alta graduacin.  Trabaje con su mdico para mantener un peso  saludable o perder peso. Pregntele cul es el peso recomendado para usted.  Realice al menos 30 minutos de ejercicio que haga que se acelere su corazn (ejercicio aerbico) la mayora de los das de la semana. Estas actividades pueden incluir caminar, nadar o andar en bicicleta.  Trabaje con su mdico o especialista en alimentacin y nutricin (nutricionista) para ajustar su plan alimentario a sus necesidades calricas personales. Lectura de las etiquetas de los alimentos   Verifique en las etiquetas de los alimentos, la cantidad de sodio por porcin. Elija alimentos con menos del 5 por ciento del valor diario de sodio. Generalmente, los alimentos con menos de 300 mg de sodio por porcin se encuadran dentro de este plan alimentario.  Para encontrar cereales integrales, busque la palabra "integral" como primera palabra en la lista de ingredientes. De compras  Compre productos en los que en su etiqueta diga: "bajo contenido de sodio" o "sin agregado de sal".  Compre alimentos frescos. Evite los alimentos enlatados y comidas precocidas o congeladas. Coccin  Evite agregar sal cuando cocine. Use hierbas o aderezos sin sal, en lugar de sal de mesa o sal marina. Consulte al mdico o farmacutico antes de usar sustitutos de la sal.  No fra los alimentos. A la hora de cocinar los alimentos opte por hornearlos, hervirlos, grillarlos y asarlos a la parrilla.  Cocine con aceites cardiosaludables, como oliva, canola, soja o girasol. Planificacin de las comidas  Consuma una dieta equilibrada, que incluya lo siguiente: ? 5o ms porciones de frutas y verduras por   da. Trate de que la mitad del plato de cada comida sean frutas y verduras. ? Hasta 6 u 8 porciones de cereales integrales por da. ? Menos de 6 onzas de carne, aves o pescado magros por da. Una porcin de 3 onzas de carne tiene casi el mismo tamao que un mazo de cartas. Un huevo equivale a 1 onza. ? Dos porciones de productos lcteos  descremados por da. ? Una porcin de frutos secos, semillas o frijoles 5 veces por semana. ? Grasas cardiosaludables. Las grasas saludables llamadas cidos grasos omega-3 se encuentran en alimentos como semillas de lino y pescados de agua fra, como por ejemplo, sardinas, salmn y caballa.  Limite la cantidad que ingiere de los siguientes alimentos: ? Alimentos enlatados o envasados. ? Alimentos con alto contenido de grasa trans, como alimentos fritos. ? Alimentos con alto contenido de grasa saturada, como carne con grasa. ? Dulces, postres, bebidas azucaradas y otros alimentos con azcar agregada. ? Productos lcteos enteros.  No le agregue sal a los alimentos antes de probarlos.  Trate de comer al menos 2 comidas vegetarianas por semana.  Consuma ms comida casera y menos de restaurante, de bufs y comida rpida.  Cuando coma en un restaurante, pida que preparen su comida con menos sal o, en lo posible, sin nada de sal. Qu alimentos se recomiendan? Los alimentos enumerados a continuacin no constituyen una lista completa. Hable con el nutricionista sobre las mejores opciones alimenticias para usted. Cereales Pan de salvado o integral. Pasta de salvado o integral. Arroz integral. Avena. Quinua. Trigo burgol. Cereales integrales y con bajo contenido de sodio. Pan pita. Galletitas de agua con bajo contenido de grasa y sodio. Tortillas de harina integral. Verduras Verduras frescas o congeladas (crudas, al vapor, asadas o grilladas). Jugos de tomate y verduras con bajo contenido de sodio o reducidos en sodio. Salsa y pasta de tomate con bajo contenido de sodio o reducidas en sodio. Verduras enlatadas con bajo contenido de sodio o reducidas en sodio. Frutas Todas las frutas frescas, congeladas o disecadas. Frutas enlatadas en jugo natural (sin agregado de azcar). Carne y otros alimentos proteicos Pollo o pavo sin piel. Carne de pollo o de pavo molida. Cerdo desgrasado. Pescado y mariscos.  Claras de huevo. Porotos, guisantes o lentejas secos. Frutos secos, mantequilla de frutos secos y semillas sin sal. Frijoles enlatados sin sal. Cortes de carne vacuna magra, desgrasada. Embutidos magros, con bajo contenido de sodio. Lcteos Leche descremada (1%) o descremada. Quesos sin grasa, con bajo contenido de grasa o descremados. Queso blanco o ricota sin grasa, con bajo contenido de sodio. Yogur semidescremado o descremado. Queso con bajo contenido de grasa y sodio. Grasas y aceites Margarinas untables que no contengan grasas trans. Aceite vegetal. [place] y aderezos para ensaladas livianos o con bajo contenido de grasas (reducidos en sodio). Aceite de canola, crtamo, oliva, soja y girasol. [place]. Condimentos y otros alimentos Hierbas. Especias. Mezclas de condimentos sin sal. Palomitas de maz y pretzels sin sal. Dulces con bajo contenido de grasas. Qu alimentos no se recomiendan? Los alimentos enumerados a continuacin no constituyen una lista completa. Hable con el nutricionista sobre las mejores opciones alimenticias para usted. Cereales Productos de panificacin hechos con grasa, como medialunas, magdalenas y algunos panes. Comidas con arroz o pasta seca listas para usar. Verduras Verduras con crema o fritas. Verduras en salsa de queso. Verduras enlatadas regulares (que no sean con bajo contenido de sodio o reducidas en sodio). Pasta y salsa de tomates enlatadas regulares (que no sean   sean con bajo contenido de sodio o reducidas en sodio). Jugos de tomate y verduras regulares (que no sean con bajo contenido de sodio o reducidos en sodio). Pepinillos. Aceitunas. Frutas Fruta enlatada en almbar liviano o espeso. Frutas cocidas en aceite. Frutas con salsa de crema o manteca. Carne y otros alimentos proteicos Cortes de carne con grasa. Costillas. Carne frita. Tocino. Salchichas. Mortadela y otras carnes procesadas. Salame. Panceta. Perros calientes (hotdogs). Salchicha de cerdo. Frutos  secos y semillas con sal. Frijoles enlatados con agregado de sal. Pescado enlatado o ahumado. Huevos enteros o yemas. Pollo o pavo con piel. Lcteos Leche entera o al 2%, crema y mitad leche y mitad crema. Queso crema entero o con toda su grasa. Yogur entero o endulzado. Quesos con toda su grasa. Sustitutos de cremas no lcteas. Coberturas batidas. Quesos para untar y quesos procesados. Grasas y aceites Mantequilla. Margarina en barra. Manteca de cerdo. Materia grasa. Mantequilla clarificada. Grasa de panceta. Aceites tropicales como aceite de coco, palmiste o palma. Condimentos y otros alimentos Palomitas de maz y pretzels con sal. Sal de cebolla, sal de ajo, sal condimentada, sal de mesa y sal marina. Salsa [place]. Salsa trtara. Salsa barbacoa. Salsa teriyaki. Salsa de soja, incluso la que tiene contenido reducido de sodio. Salsa de carne. Salsas en lata y envasadas. Salsa de pescado. Salsa de ostras. Salsa rosada. Rbano picante envasado. [place]. Mostaza. Saborizantes y tiernizantes para carne. Caldo en cubitos. Salsa picante y salsa tabasco. Escabeches envasados o ya preparados. Aderezos para tacos prefabricados o envasados. Salsas. Aderezos comunes para ensalada. Dnde encontrar ms informacin:  Instituto Nacional del Corazn, los Pulmones y la Sangre (National Heart, Lung, and Blood Institute): www.nhlbi.nih.gov  Asociacin Estadounidense del Corazn (American Heart Association): www.heart.org Resumen  El plan de alimentacin DASH ha demostrado bajar la presin arterial elevada (hipertensin). Tambin puede reducir el riesgo de diabetes tipo 2, enfermedad cardaca y accidente cerebrovascular.  Con el plan de alimentacin DASH, deber limitar el consumo de sal (sodio) a 2,300 mg por da. Si tiene hipertensin, es posible que necesite reducir la ingesta de sodio a 1,500 mg por da.  Cuando siga el plan de alimentacin DASH, trate de comer ms frutas frescas y verduras, cereales  integrales, carnes magras, lcteos descremados y grasas cardiosaludables.  Trabaje con su mdico o especialista en alimentacin y nutricin (nutricionista) para ajustar su plan alimentario a sus necesidades calricas personales. Esta informacin no tiene como fin reemplazar el consejo del mdico. Asegrese de hacerle al mdico cualquier pregunta que tenga. Document Revised: 12/06/2016 Document Reviewed: 12/06/2016 Elsevier Patient Education  2020 Elsevier Inc.  Hipertensin en los adultos Hypertension, Adult El trmino hipertensin es otra forma de denominar a la presin arterial elevada. La presin arterial elevada fuerza al corazn a trabajar ms para bombear la sangre. Esto puede causar problemas con el paso del tiempo. Una lectura de presin arterial est compuesta por 2 nmeros. Hay un nmero superior (sistlico) sobre un nmero inferior (diastlico). Lo ideal es tener la presin arterial por debajo de 120/80. Las elecciones saludables pueden ayudar a bajar la presin arterial, o tal vez necesite medicamentos para bajarla. Cules son las causas? Se desconoce la causa de esta afeccin. Algunas afecciones pueden estar relacionadas con la presin arterial alta. Qu incrementa el riesgo?  Fumar.  Tener diabetes mellitus tipo 2, colesterol alto, o ambos.  No hacer la cantidad suficiente de actividad fsica o ejercicio.  Tener sobrepeso.  Consumir mucha grasa, azcar, caloras o sal (sodio) en su dieta.  Beber alcohol en   Tener una enfermedad renal a largo plazo (crnica).  Tener antecedentes familiares de presin arterial alta.  Edad. Los riesgos aumentan con la edad.  Raza. El riesgo es mayor para las Statistician.  Sexo. Antes de los 45aos, los hombres corren ms [employer]. Despus de los 65aos, las mujeres corren ms [employer].  Tener apnea obstructiva del sueo.  Estrs. Cules son los signos o los sntomas?  Es  posible que la presin arterial alta puede no cause sntomas. La presin arterial muy alta (crisis hipertensiva) puede provocar: ? Dolor de [place]. ? Sensaciones de preocupacin o nerviosismo (ansiedad). ? Falta de aire. ? Hemorragia nasal. ? Sensacin de Journalist, newspaper (nuseas). ? Vmitos. ? Cambios en la forma de ver. ? Dolor muy intenso en el pecho. ? Convulsiones. Cmo se trata?  Esta afeccin se trata haciendo cambios saludables en el estilo de vida, por ejemplo: ? Consumir alimentos saludables. ? Hacer ms ejercicio. ? Beber menos alcohol.  El mdico puede recetarle medicamentos si los cambios en el estilo de vida no son suficientes para Museum/gallery curator la presin arterial y si: ? El nmero de arriba est por encima de 130. ? El nmero de abajo est por encima de 80.  Su presin arterial personal ideal puede variar. Siga estas instrucciones en su casa: Comida y bebida   Si se lo dicen, siga el plan de alimentacin de DASH (Dietary Approaches to Stop Hypertension, Maneras de alimentarse para detener la hipertensin). Para seguir este plan: ? Llene la mitad del plato de cada comida con frutas y verduras. ? Llene un cuarto del plato de cada comida con cereales integrales. Los cereales integrales incluyen pasta integral, arroz integral y pan integral. ? Coma y beba productos lcteos con bajo contenido de grasa, como leche descremada o yogur bajo en grasas. ? Llene un cuarto del plato de cada comida con protenas bajas en grasa (magras). Las protenas bajas en grasa incluyen pescado, pollo sin piel, huevos, frijoles y tofu. ? Evite consumir carne grasa, carne curada y procesada, o pollo con piel. ? Evite consumir alimentos prehechos o procesados.  Consuma menos de 1500 mg de sal por da.  No beba alcohol si: ? El mdico le indica que no lo haga. ? Est embarazada, puede estar embarazada o est tratando de quedar embarazada.  Si bebe alcohol: ? Limite la cantidad  que bebe a lo siguiente:  De 0 a 1 medida por da para las mujeres.  De 0 a 2 medidas por da para los hombres. ? Est atento a la cantidad de alcohol que hay en las bebidas que toma. En los [place], una medida equivale a una botella de cerveza de 12oz ( ), un vaso de vino de 5oz ( ) o un vaso de una bebida alcohlica de alta graduacin de 1oz (75ml). Estilo de vida   Trabaje con su mdico para mantenerse en un peso saludable o para perder peso. Pregntele a su mdico cul es el peso recomendable para usted.  Haga al menos de ejercicio la [employer] de la [place]. Estos pueden incluir caminar, nadar o andar en bicicleta.  Realice al menos 30 minutos de ejercicio que fortalezca sus msculos (ejercicios de resistencia) al menos 3 das a la [place]. Estos pueden incluir levantar pesas o hacer [place].  No consuma ningn producto que contenga nicotina o tabaco, como cigarrillos, cigarrillos electrnicos y tabaco de Theatre manager. Si necesita ayuda para dejar de fumar, consulte al mdico.  Controle su presin arterial en su casa tal como le indic el mdico.  Concurra a todas las visitas de seguimiento como se lo haya indicado el mdico. Esto es importante. Medicamentos  [employer] de venta libre y los recetados solamente como se lo haya indicado el mdico. Siga cuidadosamente las indicaciones.  No omita las dosis de medicamentos para la presin arterial. Los medicamentos pierden eficacia si omite dosis. El hecho de omitir las dosis tambin [place] el riesgo de otros problemas.  Pregntele a su mdico a qu efectos secundarios o reacciones a los Museum/gallery curator. Comunquese con un mdico si:  Piensa que tiene [place] reaccin a los medicamentos que est tomando.  Tiene dolores de cabeza frecuentes (recurrentes).  Se siente mareado.  Tiene hinchazn en los tobillos.  Tiene problemas de visin. Solicite ayuda inmediatamente  si:  Siente un dolor de cabeza muy intenso.  Empieza a sentirse desorientado (confundido).  Se siente dbil o adormecido.  Siente que va a desmayarse.  Tiene un dolor muy intenso en las siguientes zonas: ? Pecho. ? Vientre (abdomen).  Vomita ms de una vez.  Tiene dificultad para respirar. Resumen  El trmino hipertensin es otra forma de denominar a la presin arterial elevada.  La presin arterial elevada fuerza al corazn a trabajar ms para bombear la sangre.  Para la [employer], una presin arterial normal es menor que 120/80.  Las decisiones saludables pueden ayudarle a disminuir su presin arterial. Si no puede bajar su presin arterial mediante decisiones saludables, es posible que deba tomar medicamentos. Esta informacin no tiene Theme park manager el consejo del mdico. Asegrese de hacerle al mdico cualquier pregunta que tenga. Document Revised: 06/01/2018 Document Reviewed: 06/01/2018 Elsevier Patient Education  2020 [employer].

## 2020-07-31 ENCOUNTER — Telehealth: Payer: Self-pay

## 2020-07-31 LAB — COMPREHENSIVE METABOLIC PANEL
ALT: 39 IU/L (ref 0–44)
AST: 53 IU/L — ABNORMAL HIGH (ref 0–40)
Albumin/Globulin Ratio: 1 — ABNORMAL LOW (ref 1.2–2.2)
Albumin: 4 g/dL (ref 4.0–5.0)
Alkaline Phosphatase: 124 IU/L — ABNORMAL HIGH (ref 44–121)
BUN/Creatinine Ratio: 12 (ref 9–20)
BUN: 9 mg/dL (ref 6–24)
Bilirubin Total: 0.6 mg/dL (ref 0.0–1.2)
CO2: 26 mmol/L (ref 20–29)
Calcium: 8.2 mg/dL — ABNORMAL LOW (ref 8.7–10.2)
Chloride: 102 mmol/L (ref 96–106)
Creatinine, Ser: 0.74 mg/dL — ABNORMAL LOW (ref 0.76–1.27)
GFR calc Af Amer: 127 mL/min/{1.73_m2} (ref >59)
GFR calc non Af Amer: 110 mL/min/{1.73_m2} (ref >59)
Globulin, Total: 4 g/dL (ref 1.5–4.5)
Glucose: 83 mg/dL (ref 65–99)
Potassium: 3.5 mmol/L (ref 3.5–5.2)
Sodium: 142 mmol/L (ref 134–144)
Total Protein: 8 g/dL (ref 6.0–8.5)

## 2020-07-31 LAB — CBC WITH DIFFERENTIAL/PLATELET
Basophils Absolute: 0.1 10*3/uL (ref 0.0–0.2)
Basos: 1 %
EOS (ABSOLUTE): 0.3 10*3/uL (ref 0.0–0.4)
Eos: 3 %
Hematocrit: 45.1 % (ref 37.5–51.0)
Hemoglobin: 15.7 g/dL (ref 13.0–17.7)
Immature Grans (Abs): 0 10*3/uL (ref 0.0–0.1)
Immature Granulocytes: 0 %
Lymphocytes Absolute: 2.9 10*3/uL (ref 0.7–3.1)
Lymphs: 34 %
MCH: 33.7 pg — ABNORMAL HIGH (ref 26.6–33.0)
MCHC: 34.8 g/dL (ref 31.5–35.7)
MCV: 97 fL (ref 79–97)
Monocytes Absolute: 0.8 10*3/uL (ref 0.1–0.9)
Monocytes: 10 %
Neutrophils Absolute: 4.4 10*3/uL (ref 1.4–7.0)
Neutrophils: 52 %
Platelets: 187 10*3/uL (ref 150–450)
RBC: 4.66 x10E6/uL (ref 4.14–5.80)
RDW: 12.2 % (ref 11.6–15.4)
WBC: 8.5 10*3/uL (ref 3.4–10.8)

## 2020-07-31 LAB — MICROALBUMIN, URINE: Microalbumin, Urine: 53.6 ug/mL

## 2020-07-31 NOTE — Telephone Encounter (Signed)
Spoke with pt and gave him his normal lab results, he asked if you let him know how his kidneys are doing  and about  the urine that's was discussed at visit. Please advise

## 2020-08-01 NOTE — Telephone Encounter (Signed)
Returned pt call  

## 2020-08-01 NOTE — Telephone Encounter (Signed)
Kidney function test is normal.  Urinalysis and protein in urine are normal. Thanks.

## 2020-08-02 NOTE — Progress Notes (Signed)
His kidneys are normal and protein in the urine is much less.Continue medications.

## 2020-09-24 NOTE — Telephone Encounter (Signed)
error 

## 2021-01-28 ENCOUNTER — Ambulatory Visit: Payer: Self-pay | Admitting: Emergency Medicine

## 2021-01-28 DIAGNOSIS — Z0289 Encounter for other administrative examinations: Secondary | ICD-10-CM

## 2021-04-06 ENCOUNTER — Other Ambulatory Visit: Payer: Self-pay | Admitting: Emergency Medicine

## 2021-04-06 DIAGNOSIS — F411 Generalized anxiety disorder: Secondary | ICD-10-CM

## 2021-09-05 ENCOUNTER — Emergency Department (HOSPITAL_COMMUNITY): Admission: EM | Admit: 2021-09-05 | Discharge: 2021-09-05 | Discharge status: Against Medical Advice | Payer: Self-pay

## 2021-09-05 NOTE — ED Notes (Signed)
Patient was called for triage but no response. x1 

## 2021-09-05 NOTE — ED Notes (Signed)
Pt still not in lobby when called, removing from system

## 2021-10-17 ENCOUNTER — Other Ambulatory Visit: Payer: Self-pay | Admitting: Emergency Medicine

## 2021-10-17 ENCOUNTER — Emergency Department (HOSPITAL_COMMUNITY)
Admission: EM | Admit: 2021-10-17 | Discharge: 2021-10-17 | Discharge status: Against Medical Advice | Payer: Self-pay | Attending: Emergency Medicine | Admitting: Emergency Medicine

## 2021-10-17 ENCOUNTER — Other Ambulatory Visit: Payer: Self-pay

## 2021-10-17 DIAGNOSIS — R7303 Prediabetes: Secondary | ICD-10-CM

## 2021-10-17 DIAGNOSIS — I10 Essential (primary) hypertension: Secondary | ICD-10-CM | POA: Insufficient documentation

## 2021-10-17 DIAGNOSIS — E1165 Type 2 diabetes mellitus with hyperglycemia: Secondary | ICD-10-CM | POA: Insufficient documentation

## 2021-10-17 DIAGNOSIS — Z7984 Long term (current) use of oral hypoglycemic drugs: Secondary | ICD-10-CM | POA: Insufficient documentation

## 2021-10-17 DIAGNOSIS — Z79899 Other long term (current) drug therapy: Secondary | ICD-10-CM | POA: Insufficient documentation

## 2021-10-17 DIAGNOSIS — F419 Anxiety disorder, unspecified: Secondary | ICD-10-CM | POA: Insufficient documentation

## 2021-10-17 LAB — CBG MONITORING, ED: Glucose-Capillary: 131 mg/dL — ABNORMAL HIGH (ref 70–99)

## 2021-10-17 MED ORDER — AMLODIPINE BESYLATE 5 MG PO TABS
5.0000 mg | ORAL_TABLET | Freq: Once | ORAL | Status: AC
  Administered 2021-10-17: 5 mg via ORAL
  Filled 2021-10-17: qty 1

## 2021-10-17 MED ORDER — AMLODIPINE BESYLATE 5 MG PO TABS: 5.0000 mg | ORAL_TABLET | Freq: Every day | ORAL | 0 refills | Status: AC

## 2021-10-17 NOTE — ED Provider Notes (Signed)
[facility]ERGENCY [facility]PT Provider Note   CSN: [id number] Arrival date & time: 10/17/21  0737     History  Chief Complaint  Patient presents with   Anxiety    Geoge Erubiel Schmahl is a 51 y.o. male diabetes and hypertension.  Presents to the emergency department with a complaint of anxiety and hypertension.  Patient reports that he has been noncompliant with his blood pressure medication over the last year.  Reports he has not been taking it because he was not feeling bad.  States that 2 days prior he felt a little bit dizzy and started becoming anxious about his blood pressure.  Patient denies any dizziness since that isolated episode.  Patient checked blood pressure in outpatient setting and noted to be elevated.  Patient came to the [facility] apartment today for refill of medications.  Patient denies any numbness, weakness, facial asymmetry, dysarthria, headache, visual disturbance, chest pain, shortness of breath.  Patient endorses drinking 3 beers daily.  Denies any illicit drug use.  Patient was offered interpreter to conduct this interview however declines.  Patient was given option to use interpreter at any point he felt needed during interview.   Anxiety Pertinent negatives include no chest pain, no abdominal pain, no headaches and no shortness of breath.      Home Medications Prior to Admission medications   Medication Sig Start Date End Date Taking? Authorizing Provider  amLODipine (NORVASC) 5 MG tablet Take 1 tablet (5 mg total) by mouth daily. 05/01/20 07/30/20  Georgina Quint, MD  carvedilol (COREG) 25 MG tablet TAKE 1 TABLET BY MOUTH TWICE DAILY WITH A MEAL 05/01/20   Sagardia, Eilleen Kempf, MD  clonazePAM (KLONOPIN) 1 MG tablet TAKE 1 TABLET BY MOUTH ONCE DAILY AS NEEDED FOR  ANXIETY.  DO  NOT  TAKE  EVERY  DAY 07/30/20   Georgina Quint, MD  lisinopril (ZESTRIL) 5 MG tablet Take 1 tablet (5 mg total) by mouth daily. 05/03/20   Georgina Quint, MD  metFORMIN (GLUCOPHAGE) 500 MG tablet Take 1 tablet (500 mg total) by mouth 2 (two) times daily with a meal. TAKE 1 TABLET BY MOUTH TWICE DAILY WITH A MEAL. 07/30/20 10/28/20  Georgina Quint, MD  PARoxetine (PAXIL) 20 MG tablet Take 1 tablet (20 mg total) by mouth daily. 08/28/18 11/26/18  Georgina Quint, MD  pravastatin (PRAVACHOL) 20 MG tablet Take 1 tablet (20 mg total) by mouth daily. 05/01/20   Georgina Quint, MD      Allergies    Aspirin and Other    Review of Systems   Review of Systems  Constitutional:  Negative for chills and fever.  Eyes:  Negative for visual disturbance.  Respiratory:  Negative for shortness of breath.   Cardiovascular:  Negative for chest pain and palpitations.  Gastrointestinal:  Negative for abdominal pain, nausea and vomiting.  Musculoskeletal:  Negative for back pain and neck pain.  Skin:  Negative for color change and rash.  Neurological:  Positive for dizziness. Negative for tremors, seizures, syncope, facial asymmetry, speech difficulty, weakness, light-headedness, numbness and headaches.  Psychiatric/Behavioral:  Negative for confusion. The patient is nervous/anxious.    Physical Exam Updated Vital Signs BP (!) 170/120 (BP Location: Left Arm)    Pulse 83    Temp 99.4 F (37.4 C)    Resp 18    Ht 5' 2.21" (1.58 m)    Wt 95.7 kg    SpO2 98%    BMI  38.33 kg/m  Physical Exam Vitals and nursing note reviewed.  Constitutional:      General: He is not in acute distress.    Appearance: He is not ill-appearing, toxic-appearing or diaphoretic.  HENT:     Head: Normocephalic.  Eyes:     General: No scleral icterus.       Right eye: No discharge.        Left eye: No discharge.  Cardiovascular:     Rate and Rhythm: Normal rate.     Pulses:          Radial pulses are 2+ on the right side and 2+ on the left side.  Pulmonary:     Effort: Pulmonary effort is normal. No tachypnea or bradypnea.     Breath sounds: Normal  breath sounds. No stridor.  Skin:    General: Skin is warm and dry.  Neurological:     General: No focal deficit present.     Mental Status: He is alert.     GCS: GCS eye subscore is 4. GCS verbal subscore is 5. GCS motor subscore is 6.     Comments: No facial asymmetry or dysarthria.  Moves all limbs equally without difficulty.  Psychiatric:        Behavior: Behavior is cooperative.    ED Results / Procedures / Treatments   Labs (all labs ordered are listed, but only abnormal results are displayed) Labs Reviewed  CBG MONITORING, ED - Abnormal; Notable for the following components:      Result Value   Glucose-Capillary 131 (*)    All other components within normal limits    EKG None  Radiology No results found.  Procedures Procedures    Medications Ordered in ED Medications  amLODipine (NORVASC) tablet 5 mg (has no administration in time range)    ED Course/ Medical Decision Making/ A&P                           Medical Decision Making Amount and/or Complexity of Data Reviewed Labs: ordered.  Risk Prescription drug management.   Alert 51 year old male in no acute distress, nontoxic-appearing.  Presents to the emergency department with a chief complaint of anxiety and hypertension.  Information was obtained from patient.  Past medical records were reviewed.  Patient has past medical history of diabetes and hypertension which complicate his care.  Patient reports noncompliance with blood medication x1 year.  Previously was prescribed Norvasc 5mg and lisinopril 5 mg.  Noted to be hypertensive in the emergency department with blood pressure of 190/130.  Patient is asymptomatic at this time.  We will give patient 5 mg Norvasc for his blood pressure at this time.  Will obtain EKG and basic lab work.   CBG was obtained and independently reviewed myself.  CBG noted to be 131.  Prior to EKG being obtained or lab results being resulted patient is requesting to leave to go  to work.  Patient was advised that leaving prior to EKG and lab work being obtained would be AGAINST MEDICAL ADVICE.  Patient could have worsening of symptoms including but not limited to death.  Patient expresses understanding and continues to want to leave the emergency department.  RN present at bedside to witness this conversation.    Due to patient's elevated blood pressure will prescribe 30 days of Norvasc.  TOC consult was placed to help with PCP needs.  Patient given information to follow-up with [place] and wellness  clinic.  Discussed strict return precautions with patient.        Final Clinical Impression(s) / ED Diagnoses Final diagnoses:  Anxiety  Hypertension, unspecified type    Rx / DC Orders ED Discharge Orders     None         Haskel Schroeder, PA-C 10/17/21 8315    Ernie Avena, MD 10/17/21 1131

## 2021-10-17 NOTE — ED Triage Notes (Signed)
Patient states he is in ED because he is having blood pressure issues and it is causing anxiety. Has prescription for amlodipine but expired in 04/2020, says he hasnt taken htn medications in over a year because he hasnt been feeling bad. Educated on importance of taking medication as prescribed. Denies pain in triage

## 2021-10-17 NOTE — Discharge Instructions (Addendum)
You came to the emergency department today to be evaluated for your anxiety and elevated blood pressure.  You left prior to lab results and EKG being obtained.  This was AGAINST MEDICAL ADVICE.  I have given you prescription for your blood pressure.  You will need to follow-up in the outpatient setting with a primary care doctor.  Please call the number listed on this paperwork to schedule a follow-up appointment.  Get help right away if you: Get a very bad headache. Start to feel mixed up (confused). Feel weak or numb. Feel faint. Have very bad pain in your: Chest. Belly (abdomen). Throw up more than once. Have trouble breathing.

## 2021-10-17 NOTE — ED Notes (Addendum)
Pt has decided to leave AMA. Pt has been educated on risks by both Therapist, sports and PA.

## 2021-10-17 NOTE — Progress Notes (Signed)
CSW attempted to meet with pt to provide information on PCP options.  Pt had decided to leave and was already gone. Daleen Squibb, MSW, LCSW 2/18/20239:30 AM

## 2023-10-14 ENCOUNTER — Ambulatory Visit: Payer: PRIVATE HEALTH INSURANCE | Attending: Family

## 2023-10-14 DIAGNOSIS — R293 Abnormal posture: Secondary | ICD-10-CM | POA: Diagnosis present

## 2023-10-14 NOTE — Therapy (Signed)
OUTPATIENT PHYSICAL THERAPY THORACOLUMBAR EVALUATION   Patient Name: Cameron Haney MRN: [id number] DOB:[birth year], 53 y.o., male Today's Date: 10/14/2023  END OF SESSION:  PT End of Session - 10/14/23 1201     Visit Number 1    Authorization Type None    PT Start Time 0845    PT Stop Time 0915    PT Time Calculation (min) 30 min    Activity Tolerance No increased pain    Behavior During Therapy WFL for tasks assessed/performed             Past Medical History:  Diagnosis Date   Anxiety    Diabetes mellitus without complication (HCC)    High cholesterol    Hypertension    Past Surgical History:  Procedure Laterality Date   CARDIAC CATHETERIZATION  2016   Most likely procedure, based on his description. Not long after arriving here. They told him nothing was wrong with his heart.   Patient Active Problem List   Diagnosis Date Noted   Situational anxiety 08/28/2018   CAD (coronary artery disease) 01/04/2018   Pre-diabetes 11/24/2016   Dyslipidemia 11/24/2016   GAD (generalized anxiety disorder) 11/01/2016   Essential hypertension 01/29/2016   Hypertensive urgency 01/29/2016    PCP: Vickey Sages, FNP   REFERRING PROVIDER: Vickey Sages, FNP   REFERRING DIAG: [phone] (ICD-10-CM) - Compression fracture of T12 vertebra (HCC)   Rationale for Evaluation and Treatment: Rehabilitation  THERAPY DIAG:  Abnormal posture  ONSET DATE: acute on chronic  SUBJECTIVE:                                                                                                                                                                                           SUBJECTIVE STATEMENT: AMN Interpreter utilized:  Patient presents to PT from correction facility accompanied by officers. PT asked repeated subjective questioning about referral for mechanical back pain after fall and compression fracture. 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 While patient did confirm some lower back pain, he repeated numerous times that his chief complaint and main source of pain stemmed for what he described as a "lump" or "bluge" that protruded from his anus, most prominently during defecation. He also reported bleeding events from same perianal area which he claims to have noted to correctional facility staff. Denies overt N/T down LE. Patient also denies that mechanical mid or lower back pain is affecting his current functional ability/mobility.   PERTINENT HISTORY:  HTN, Diabetes   PAIN:  Are you having pain?  Yes: NPRS scale: 7/10 Worst: 10/10 Pain  location: lower back Pain description: sharp Aggravating factors: defecation Relieving factors: none  PRECAUTIONS: None  RED FLAGS: None   WEIGHT BEARING RESTRICTIONS: No  FALLS:  Has patient fallen in last 6 months? Yes. Number of falls - one leading to thoracic compression fracture  LIVING ENVIRONMENT: Inmate at [facility]   OCCUPATION: N/A  PLOF: Independent  PATIENT GOALS: N/A  NEXT MD VISIT: Information unknown to therapist at this time  OBJECTIVE:  Note: Objective measures were completed at Evaluation unless otherwise noted.  DIAGNOSTIC FINDINGS:  None given  PATIENT SURVEYS:  DNT  COGNITION: Overall cognitive status: Within functional limits for tasks assessed     SENSATION: WFL  MUSCLE LENGTH: DNT  POSTURE: rounded shoulders, forward head, and decreased lumbar lordosis  PALPATION: Slight TTP to bilateral lumbar paraspinals  LUMBAR ROM:   AROM eval  Flexion   Extension   Right lateral flexion   Left lateral flexion   Right rotation   Left rotation    (Blank rows = not tested)  LOWER EXTREMITY ROM:     No formal objective measures assessed  LOWER EXTREMITY MMT:    No formal objective measures assessed  LUMBAR SPECIAL TESTS:  DNT  FUNCTIONAL TESTS:  DNT  GAIT: Distance walked:  31ft Assistive device utilized: None Level of assistance: Complete Independence Comments: trunk flexed - shuffling gait due to ankle and wrist shakles  TODAY'S TREATMENT: None provided                                                           PATIENT EDUCATION:  Education details: discussion of PT role and how to best manage his current complaints and care Person educated: Patient and Health and safety inspector Education method: Medical illustrator Education comprehension: verbalized understanding and returned demonstration  HOME EXERCISE PROGRAM: None provided  ASSESSMENT:  CLINICAL IMPRESSION: Patient is a 53 y.o. M who was seen today for physical therapy evaluation and treatment for back pain after mechanical fall. Subjective gathering revealed patient did not have many complaints of back pain and instead noted his main source of pain as main source of pain stemmed for what he described as a "lump" or "bluge" that protruded from his anus, most prominently during defecation as noted above. After careful questioning and examination PT determined that treatment is not currently indicated at outpatient rehab facility for this chief complaint by patient. PT reviewed questions for the patient to ask his FNP at correctional facility and that he would most likely benefit from seeking Urology or [facility] consult. Patient will not be picked up on caseload with outpatient physical therapy, PT will fax notes to correction facility FNP to determine his next steps in care.    OBJECTIVE IMPAIRMENTS: decreased activity tolerance, decreased mobility, difficulty walking, decreased strength, postural dysfunction, and pain  ACTIVITY LIMITATIONS: carrying, lifting, squatting, stairs, transfers, toileting, and hygiene/grooming  PARTICIPATION LIMITATIONS:  N/A  PERSONAL FACTORS: 1-2 comorbidities: HT, DM II  are also affecting patient's functional outcome.   REHAB POTENTIAL: N/A  CLINICAL DECISION  MAKING: Evolving/moderate complexity  EVALUATION COMPLEXITY: Moderate   GOALS: No formal goals - did not pick patient up on caseload  PLAN:  PT FREQUENCY: one time visit  PT DURATION: N/A  PLANNED INTERVENTIONS: N/A  PLAN FOR NEXT SESSION: N/A  Eloy End, PT 10/14/2023, 12:07 PM

## 2023-12-30 ENCOUNTER — Ambulatory Visit (INDEPENDENT_AMBULATORY_CARE_PROVIDER_SITE_OTHER): Payer: PRIVATE HEALTH INSURANCE | Admitting: Nurse Practitioner

## 2023-12-30 ENCOUNTER — Other Ambulatory Visit (INDEPENDENT_AMBULATORY_CARE_PROVIDER_SITE_OTHER): Payer: Self-pay

## 2023-12-30 ENCOUNTER — Encounter: Payer: Self-pay | Admitting: Nurse Practitioner

## 2023-12-30 VITALS — BP 140/86 | HR 93 | Ht 63.0 in | Wt 209.0 lb

## 2023-12-30 DIAGNOSIS — K625 Hemorrhage of anus and rectum: Secondary | ICD-10-CM

## 2023-12-30 LAB — COMPREHENSIVE METABOLIC PANEL WITH GFR
ALT: 27 U/L (ref 0–53)
AST: 27 U/L (ref 0–37)
Albumin: 4.8 g/dL (ref 3.5–5.2)
Alkaline Phosphatase: 113 U/L (ref 39–117)
BUN: 7 mg/dL (ref 6–23)
CO2: 24 meq/L (ref 19–32)
Calcium: 9.4 mg/dL (ref 8.4–10.5)
Chloride: 103 meq/L (ref 96–112)
Creatinine, Ser: 0.74 mg/dL (ref 0.40–1.50)
GFR: 105.17 mL/min (ref >60.00)
Glucose, Bld: 115 mg/dL — ABNORMAL HIGH (ref 70–99)
Potassium: 3.9 meq/L (ref 3.5–5.1)
Sodium: 138 meq/L (ref 135–145)
Total Bilirubin: 0.8 mg/dL (ref 0.2–1.2)
Total Protein: 8.9 g/dL — ABNORMAL HIGH (ref 6.0–8.3)

## 2023-12-30 MED ORDER — NA SULFATE-K SULFATE-MG SULF 17.5-3.13-1.6 GM/177ML PO SOLN: 1.0000 | Freq: Once | ORAL | 0 refills | Status: AC

## 2023-12-30 NOTE — Progress Notes (Signed)
 Noted.

## 2023-12-30 NOTE — Progress Notes (Signed)
 Call +    12/30/2023 Cameron Haney [id number] [birth year]   CHIEF COMPLAINT: Rectal prolapse, schedule a colonoscopy   HISTORY OF PRESENT ILLNESS: Cameron Haney is a 53 year old male with a past medical history of anxiety, hypertension, hyperlipidemia, DM type II and elevated LFTs. He presents to our office today as referred by [facility] provider for further evaluation regarding rectal bleeding, possible rectal prolapse and to schedule a colonoscopy. He presents accompanied by a [facility] armed guard, patient with handcuffs and ankle shackles. Patient speaks Spanish therefore he is accompanied by a Lea Spanish interpreter to facilitate communication throughout today's visit. He endorses having rectal bleeding with associated swelling/bulge to the rectum which occurred x 2 occasions October 2024 when straining to pass a bowel movement. Since then, he intermittently sees a small amount of blood on the toilet tissue and stool.  He denies having any rectal. No abdominal pain. No GERD symptoms or dysphagia. He denies ever having a screening colonoscopy. No known family history of colorectal cancer.      Latest Ref Rng & Units 07/30/2020    2:43 PM 08/22/2018    9:26 AM 01/04/2018   10:29 AM  CBC  WBC 3.4 - 10.8 x10E3/uL 8.5  8.1  7.3   Hemoglobin 13.0 - 17.7 g/dL 46.9  62.9  52.8   Hematocrit 37.5 - 51.0 % 45.1  44.3  43.1   Platelets 150 - 450 x10E3/uL 187  212  203        Latest Ref Rng & Units 07/30/2020    2:43 PM 05/01/2020   12:20 PM 08/22/2018    9:26 AM  CMP  Glucose 65 - 99 mg/dL 83  413  244   BUN 6 - 24 mg/dL 9  7  9    Creatinine 0.76 - 1.27 mg/dL 0.10  2.72  5.36   Sodium 134 - 144 mmol/L 142  142  138   Potassium 3.5 - 5.2 mmol/L 3.5  3.7  3.4   Chloride 96 - 106 mmol/L 102  103  105   CO2 20 - 29 mmol/L 26  25  24    Calcium 8.7 - 10.2 mg/dL 8.2  8.6  8.4   Total Protein 6.0 - 8.5 g/dL 8.0  8.3    Total Bilirubin 0.0 - 1.2  mg/dL 0.6  0.5    Alkaline Phos 44 - 121 IU/L 124  118    AST 0 - 40 IU/L 53  97    ALT 0 - 44 IU/L 39  73      Past Medical History:  Diagnosis Date   Anxiety    Diabetes mellitus without complication (HCC)    High cholesterol    Hypertension    Past Surgical History:  Procedure Laterality Date   CARDIAC CATHETERIZATION  2016   Most likely procedure, based on his description. Not long after arriving here. They told him nothing was wrong with his heart.   Social History: He is originally for [place].  He is currently incarcerated at [facility].  He quit smoking cigarettes 22 years ago. Past alcohol use.  No drug use.  Family History:  No family history of esophageal, gastric or colon cancer.  Mother with diabetes.  Father had a stroke.  Allergies  Allergen Reactions   Aspirin  Other (See Comments)    Dizzy    Other Other (See Comments)    Neurobion Forte: Skin "fell off"  Outpatient Encounter Medications as of 12/30/2023  Medication Sig   amLODipine  (NORVASC ) 5 MG tablet Take 1 tablet (5 mg total) by mouth daily.   carvedilol  (COREG ) 25 MG tablet TAKE 1 TABLET BY MOUTH TWICE DAILY WITH A MEAL   clonazePAM  (KLONOPIN ) 1 MG tablet TAKE 1 TABLET BY MOUTH ONCE DAILY AS NEEDED FOR  ANXIETY.  DO  NOT  TAKE  EVERY  DAY   lisinopril  (ZESTRIL ) 5 MG tablet Take 1 tablet (5 mg total) by mouth daily.   metFORMIN  (GLUCOPHAGE ) 500 MG tablet TAKE 1 TABLET BY MOUTH TWICE DAILY WITH A MEAL   PARoxetine  (PAXIL ) 20 MG tablet Take 1 tablet (20 mg total) by mouth daily.   pravastatin  (PRAVACHOL ) 20 MG tablet Take 1 tablet (20 mg total) by mouth daily.   No facility-administered encounter medications on file as of 12/30/2023.    REVIEW OF SYSTEMS:  Gen: Denies fever, sweats or chills. No weight loss.  CV: Denies chest pain, palpitations or edema. Resp: Denies cough, shortness of breath of hemoptysis.  GI: See HPI. GU: Denies urinary burning, blood in urine, increased  urinary frequency or incontinence. MS: Denies joint pain, muscles aches or weakness. Derm: Denies rash, itchiness, skin lesions or unhealing ulcers. Psych: Denies depression, anxiety, memory loss or confusion. Heme: Denies bruising, easy bleeding. Neuro:  Denies headaches, dizziness or paresthesias. Endo:  Denies any problems with DM, thyroid  or adrenal function.  PHYSICAL EXAM: BP (!) 140/86   Pulse 93   Ht 5\' 3"  (1.6 m)   Wt 209 lb (94.8 kg)   BMI 37.02 kg/m   General: 53 year old male in no acute distress. Head: Normocephalic and atraumatic. Eyes:  Sclerae non-icteric, conjunctive pink. Ears: Normal auditory acuity. Mouth: Dentition intact. No ulcers or lesions.  Neck: Supple, no lymphadenopathy or thyromegaly.  Lungs: Clear bilaterally to auscultation without wheezes, crackles or rhonchi. Heart: Regular rate and rhythm. No murmur, rub or gallop appreciated.  Abdomen: Soft, nontender, nondistended. No masses. No hepatosplenomegaly. Normoactive bowel sounds x 4 quadrants.  Rectal: Deferred, to proceed with colonoscopy for further evaluation. Musculoskeletal: Symmetrical with no gross deformities. Skin: Warm and dry. No rash or lesions on visible extremities. Extremities: No edema. Neurological: Alert oriented x 4, no focal deficits.  Psychological:  Alert and cooperative. Normal mood and affect.  ASSESSMENT AND PLAN:  53 year old male with rectal bleeding and possible hemorrhoids versus rectal prolapse. -Colonoscopy at [facility] per protocol regarding incarcerated patients, benefits and risks discussed including risk with sedation, risk of bleeding, perforation and infection  -Miralax Q HS to avoid straining - CBC, CMP - Further recommendations to be determined after colonoscopy completed  History of elevated LFTs - Labs as ordered above  Diabetes mellitus type 2       CC:  No ref. provider found

## 2023-12-30 NOTE — Patient Instructions (Signed)
Your provider has requested that you go to the basement level for lab work before leaving today. Press "B" on the elevator. The lab is located at the first door on the left as you exit the elevator.   Due to recent changes in healthcare laws, you may see the results of your imaging and laboratory studies on MyChart before your provider has had a chance to review them.  We understand that in some cases there may be results that are confusing or concerning to you. Not all laboratory results come back in the same time frame and the provider may be waiting for multiple results in order to interpret others.  Please give us 48 hours in order for your provider to thoroughly review all the results before contacting the office for clarification of your results.    Thank you for trusting me with your gastrointestinal care!   Colleen Kennedy-Smith, CRNP   

## 2024-01-01 LAB — CBC
HCT: 43.9 % (ref 39.0–52.0)
Hemoglobin: 15 g/dL (ref 13.0–17.0)
MCHC: 34.2 g/dL (ref 30.0–36.0)
MCV: 90.9 fl (ref 78.0–100.0)
Platelets: 164 10*3/uL (ref 150.0–400.0)
RBC: 4.83 Mil/uL (ref 4.22–5.81)
RDW: 13.7 % (ref 11.5–15.5)
WBC: 7.1 10*3/uL (ref 4.0–10.5)

## 2024-01-19 ENCOUNTER — Telehealth: Payer: Self-pay | Admitting: Nurse Practitioner

## 2024-01-19 NOTE — Telephone Encounter (Signed)
 Inbound call from Tiara at [facility] requesting a call to discuss if patient's 7/9 colonoscopy at [facility] could be moved up. Call back number is [phone]. Please advise, thank you

## 2024-01-19 NOTE — Telephone Encounter (Signed)
 Spoke to [employer], nurse at [facility] who asks if there is any way patient's colonoscopy procedure could be moved to a sooner date. She says that [place] was inadvertently scheduled with [facility] when she was off. She got a call from one of the nurses at that office yesterday indicating that patient needed an urgent colonoscopy. At that time, Gara July says she realized that patient already had evaluation and is already scheduled for procedure. I have attempted to find records in care everywhere that would indicate why patient would need an urgent colonoscopy but am unable to locate any information. Per Tiara, patient does not seem to be experiencing any new or worsening symptoms (continues with some rectal bleeding, ?rectal prolapse). She is advised that I do not currently have any sooner hospital availability with Dr Elvin Hammer. Tiara says she will have the in house provider to evaluate patient and if anything is urgent, she will call our office back to discuss any possible changes that could be made.

## 2024-02-29 ENCOUNTER — Encounter (HOSPITAL_COMMUNITY): Payer: Self-pay | Admitting: Internal Medicine

## 2024-02-29 NOTE — Progress Notes (Signed)
 Preop instructions for: Cameron Haney    Date of Birth:  01/10/1971                Date of Procedure:  03/07/24 Procedure:  Colonoscopy Surgeon: Dr. Abran Facility contact:  [facility]       Phone:  [phone] RN contact name/phone#:   Wyn         and Fax #: [phone]   Transportation contact phone#: Facility Transportation Time to arrive at [facility]: 0945 am   Report to: Admitting - Go through main entrance of hospital and tell front desk you are having a procedure done, and they will show how to get to admitting dept.   **Patient will be clear liquids all day prior to procedure drink prep in the evening and also into morning of procedure at set times- full instructions and prep solution provided by GI Office, if do not have this info please call the GI office to be re-sent at [phone].**  May have the following until 0700 am day of procedure  CLEAR LIQUID DIET Water Black Coffee (sugar ok, NO MILK/CREAM OR CREAMERS)  Tea (sugar ok, NO MILK/CREAM OR CREAMERS) regular and decaf                             Plain Jell-O (NO RED)                                           Fruit ices (not with fruit pulp, NO RED)                                     Popsicles (NO RED)                                                                  Juice: apple, WHITE grape, WHITE cranberry Sports drinks like Gatorade (NO RED)   Take these morning medications only with sips of water.(or give through gastrostomy or feeding tube):   Take- Prevastatin, Coreg , Norvasc  HOLD- Metformin  hold 24hr prior to procedure, can take Tues AM but hold Tues PM and Wed AM, just AM of procedure hold Lisinipril.   Note: No Insulin or Diabetic meds should be given or taken the morning of the procedure!   Please send day of procedure: current med list and meds last taken that day, confirm nothing by mouth status from what time, Patient Demographic info( to include DNR  status, problem list, allergies) Bring Insurance card and ID, can shower & use deodorant but nothing else on skin.Leave all jewelry and other valuables at place where living (anything with metal- remove) Any questions prior to procedure call pre surg nurse Elenor [phone] Any questions DAY OF procedure, call ENDO DEPT. [phone]   Sent from :Elenor, RN-[facility]   Phone:[phone] Fax:[phone]

## 2024-03-01 ENCOUNTER — Telehealth: Payer: Self-pay

## 2024-03-01 NOTE — Telephone Encounter (Signed)
 Noted

## 2024-03-01 NOTE — Telephone Encounter (Signed)
 Procedure:COLON Procedure date: 03/07/24 Procedure location: [facility] Arrival Time: 9:45 Spoke with the patient Y/N: N Any prep concerns? N  Has the patient obtained the prep from the pharmacy ? N Do you have a care partner and transportation: N Any additional concerns? N  I called patient on 3 different occasions, using both home and mobil number the phone continued to ring and then there was a busy signal,I was unable to leave a detailed message

## 2024-03-07 ENCOUNTER — Encounter (HOSPITAL_COMMUNITY): Payer: Self-pay | Admitting: Internal Medicine

## 2024-03-07 ENCOUNTER — Ambulatory Visit (HOSPITAL_COMMUNITY)
Admission: RE | Admit: 2024-03-07 | Discharge: 2024-03-07 | Disposition: A | Discharge status: Home / Self Care | Payer: PRIVATE HEALTH INSURANCE | Attending: Internal Medicine | Admitting: Internal Medicine

## 2024-03-07 ENCOUNTER — Other Ambulatory Visit: Payer: Self-pay

## 2024-03-07 ENCOUNTER — Encounter (HOSPITAL_COMMUNITY)
Admission: RE | Disposition: A | Discharge status: Home / Self Care | Payer: Self-pay | Source: Home / Self Care | Attending: Internal Medicine

## 2024-03-07 ENCOUNTER — Ambulatory Visit (HOSPITAL_BASED_OUTPATIENT_CLINIC_OR_DEPARTMENT_OTHER): Payer: PRIVATE HEALTH INSURANCE | Admitting: Anesthesiology

## 2024-03-07 ENCOUNTER — Ambulatory Visit (HOSPITAL_COMMUNITY): Payer: PRIVATE HEALTH INSURANCE | Admitting: Anesthesiology

## 2024-03-07 DIAGNOSIS — Z87891 Personal history of nicotine dependence: Secondary | ICD-10-CM | POA: Insufficient documentation

## 2024-03-07 DIAGNOSIS — I10 Essential (primary) hypertension: Secondary | ICD-10-CM

## 2024-03-07 DIAGNOSIS — Z79899 Other long term (current) drug therapy: Secondary | ICD-10-CM | POA: Diagnosis not present

## 2024-03-07 DIAGNOSIS — E119 Type 2 diabetes mellitus without complications: Secondary | ICD-10-CM | POA: Diagnosis not present

## 2024-03-07 DIAGNOSIS — K625 Hemorrhage of anus and rectum: Secondary | ICD-10-CM

## 2024-03-07 DIAGNOSIS — I2510 Atherosclerotic heart disease of native coronary artery without angina pectoris: Secondary | ICD-10-CM | POA: Diagnosis not present

## 2024-03-07 HISTORY — PX: COLONOSCOPY: SHX5424

## 2024-03-07 LAB — GLUCOSE, CAPILLARY
Glucose-Capillary: 104 mg/dL — ABNORMAL HIGH (ref 70–99)
Glucose-Capillary: 88 mg/dL (ref 70–99)

## 2024-03-07 SURGERY — COLONOSCOPY
Anesthesia: Monitor Anesthesia Care

## 2024-03-07 MED ORDER — PROPOFOL 10 MG/ML IV BOLUS
INTRAVENOUS | Status: DC | PRN
Start: 2024-03-07 — End: 2024-03-07
  Administered 2024-03-07 (×4): 20 mg via INTRAVENOUS

## 2024-03-07 MED ORDER — PROPOFOL 500 MG/50ML IV EMUL
INTRAVENOUS | Status: DC | PRN
  Administered 2024-03-07: 180 ug/kg/min via INTRAVENOUS

## 2024-03-07 MED ORDER — SODIUM CHLORIDE 0.9 % IV SOLN: INTRAVENOUS | Status: DC

## 2024-03-07 MED ORDER — PROPOFOL 1000 MG/100ML IV EMUL
INTRAVENOUS | Status: AC
  Filled 2024-03-07: qty 100

## 2024-03-07 MED ORDER — LIDOCAINE 2% (20 MG/ML) 5 ML SYRINGE
INTRAMUSCULAR | Status: DC | PRN
  Administered 2024-03-07: 40 mg via INTRAVENOUS

## 2024-03-07 NOTE — Transfer of Care (Signed)
 Immediate Anesthesia Transfer of Care Note  Patient: Cameron Haney  Procedure(s) Performed: COLONOSCOPY  Patient Location: PACU  Anesthesia Type:MAC  Level of Consciousness: drowsy  Airway & Oxygen Therapy: Patient Spontanous Breathing and Patient connected to face mask oxygen  Post-op Assessment: Report given to RN and Post -op Vital signs reviewed and stable  Post vital signs: Reviewed  Last Vitals:  Vitals Value Taken Time  BP 90/54 03/07/24 12:05  Temp    Pulse 77 03/07/24 12:06  Resp 8 03/07/24 12:07  SpO2 100 % 03/07/24 12:06  Vitals shown include unfiled device data.  Last Pain:  Vitals:   03/07/24 1019  TempSrc: Temporal  PainSc: 0-No pain         Complications: No notable events documented.

## 2024-03-07 NOTE — Anesthesia Preprocedure Evaluation (Signed)
 Anesthesia Evaluation  Patient identified by MRN, date of birth, ID band Patient awake    Reviewed: Allergy & Precautions, H&P , NPO status , Patient's Chart, lab work & pertinent test results  Airway Mallampati: II  TM Distance: >3 FB Neck ROM: Full    Dental no notable dental hx.    Pulmonary neg pulmonary ROS, former smoker   Pulmonary exam normal breath sounds clear to auscultation       Cardiovascular hypertension, Pt. on medications negative cardio ROS Normal cardiovascular exam Rhythm:Regular Rate:Normal     Neuro/Psych   Anxiety     negative neurological ROS  negative psych ROS   GI/Hepatic negative GI ROS, Neg liver ROS,,,  Endo/Other  negative endocrine ROSdiabetes, Type 2    Renal/GU negative Renal ROS  negative genitourinary   Musculoskeletal negative musculoskeletal ROS (+)    Abdominal  (+) + obese  Peds negative pediatric ROS (+)  Hematology negative hematology ROS (+)   Anesthesia Other Findings   Reproductive/Obstetrics negative OB ROS                              Anesthesia Physical Anesthesia Plan  ASA: 3  Anesthesia Plan: MAC   Post-op Pain Management: Minimal or no pain anticipated   Induction: Intravenous  PONV Risk Score and Plan: 1 and Propofol  infusion and Treatment may vary due to age or medical condition  Airway Management Planned: Simple Face Mask  Additional Equipment:   Intra-op Plan:   Post-operative Plan:   Informed Consent: I have reviewed the patients History and Physical, chart, labs and discussed the procedure including the risks, benefits and alternatives for the proposed anesthesia with the patient or authorized representative who has indicated his/her understanding and acceptance.     Dental advisory given  Plan Discussed with: CRNA  Anesthesia Plan Comments:         Anesthesia Quick Evaluation

## 2024-03-07 NOTE — Discharge Instructions (Addendum)

## 2024-03-07 NOTE — Op Note (Signed)
 [facility] Patient Name: Cameron Haney Procedure Date: 03/07/2024 MRN: [id number] Attending MD: Norleen SAILOR. Abran , MD, [id number] Date of Birth: 1970-11-26 CSN: [id number] Age: 53 Admit Type: Outpatient Procedure:                Colonoscopy Indications:              Rectal bleeding Providers:                Norleen SAILOR. Abran, MD, Darleene Bare, RN, Curtistine Bishop, Technician Referring MD:             [employer] medical personnel Medicines:                Monitored Anesthesia Care Complications:            No immediate complications. Estimated blood loss:                            None. Estimated Blood Loss:     Estimated blood loss: none. Procedure:                Pre-Anesthesia Assessment:                           - Prior to the procedure, a History and Physical                            was performed, and patient medications and                            allergies were reviewed. The patient's tolerance of                            previous anesthesia was also reviewed. The risks                            and benefits of the procedure and the sedation                            options and risks were discussed with the patient.                            All questions were answered, and informed consent                            was obtained. Prior Anticoagulants: The patient has                            taken no anticoagulant or antiplatelet agents. ASA                            Grade Assessment: II - A patient with mild systemic  disease. After reviewing the risks and benefits,                            the patient was deemed in satisfactory condition to                            undergo the procedure.                           After obtaining informed consent, the colonoscope                            was passed under direct vision. Throughout the                             procedure, the patient's blood pressure, pulse, and                            oxygen saturations were monitored continuously. The                            CF-HQ190L ([id number] Olympus colonoscope was                            introduced through the anus and advanced to the the                            cecum, identified by appendiceal orifice and                            ileocecal valve. The ileocecal valve, appendiceal                            orifice, and rectum were photographed. The quality                            of the bowel preparation was excellent. The                            colonoscopy was performed without difficulty. The                            patient tolerated the procedure well. The bowel                            preparation used was SUPREP via split dose                            instruction. Scope In: 11:47:36 AM Scope Out: 12:00:46 PM Scope Withdrawal Time: 0 hours 10 minutes 10 seconds  Total Procedure Duration: 0 hours 13 minutes 10 seconds  Findings:      The entire examined colon appeared normal on direct and retroflexion       views. Impression:               -  The entire examined colon is normal on direct and                            retroflexion views.                           - No specimens collected. Moderate Sedation:      none Recommendation:           - Repeat colonoscopy in 10 years for screening                            purposes.                           - Patient has a contact number available for                            emergencies. The signs and symptoms of potential                            delayed complications were discussed with the                            patient. Return to normal activities tomorrow.                            Written discharge instructions were provided to the                            patient.                           - Resume previous diet.                           - Continue present  medications. Procedure Code(s):        --- Professional ---                           [phone], Colonoscopy, flexible; diagnostic, including                            collection of specimen(s) by brushing or washing,                            when performed (separate procedure) Diagnosis Code(s):        --- Professional ---                           K62.5, Hemorrhage of anus and rectum CPT copyright 2022 American Medical Association. All rights reserved. The codes documented in this report are preliminary and upon coder review may  be revised to meet current compliance requirements. Norleen SAILOR. Abran, MD 03/07/2024 12:19:28 PM This report has been signed electronically. Number of Addenda: 0

## 2024-03-07 NOTE — H&P (Signed)
 Expand All Collapse All Call +      12/30/2023 Cameron Haney [id number] [birth year]     CHIEF COMPLAINT: Rectal prolapse, schedule a colonoscopy    HISTORY OF PRESENT ILLNESS: Cameron Haney is a 53 year old male with a past medical history of anxiety, hypertension, hyperlipidemia, DM type II and elevated LFTs. He presents to our office today as referred by [facility] provider for further evaluation regarding rectal bleeding, possible rectal prolapse and to schedule a colonoscopy. He presents accompanied by a [facility] armed guard, patient with handcuffs and ankle shackles. Patient speaks Spanish therefore he is accompanied by a [employer] Spanish interpreter to facilitate communication throughout today's visit. He endorses having rectal bleeding with associated swelling/bulge to the rectum which occurred x 2 occasions October 2024 when straining to pass a bowel movement. Since then, he intermittently sees a small amount of blood on the toilet tissue and stool.  He denies having any rectal. No abdominal pain. No GERD symptoms or dysphagia. He denies ever having a screening colonoscopy. No known family history of colorectal cancer.         Latest Ref Rng & Units 07/30/2020    2:43 PM 08/22/2018    9:26 AM 01/04/2018   10:29 AM  CBC  WBC 3.4 - 10.8 x10E3/uL 8.5  8.1  7.3   Hemoglobin 13.0 - 17.7 g/dL 84.2  84.6  84.5   Hematocrit 37.5 - 51.0 % 45.1  44.3  43.1   Platelets 150 - 450 x10E3/uL 187  212  203         Latest Ref Rng & Units 07/30/2020    2:43 PM 05/01/2020   12:20 PM 08/22/2018    9:26 AM  CMP  Glucose 65 - 99 mg/dL 83  881  887   BUN 6 - 24 mg/dL 9  7  9    Creatinine 0.76 - 1.27 mg/dL 9.25  9.29  9.40   Sodium 134 - 144 mmol/L 142  142  138   Potassium 3.5 - 5.2 mmol/L 3.5  3.7  3.4   Chloride 96 - 106 mmol/L 102  103  105   CO2 20 - 29 mmol/L 26  25  24    Calcium 8.7 - 10.2 mg/dL 8.2  8.6  8.4   Total Protein 6.0 - 8.5 g/dL 8.0   8.3     Total Bilirubin 0.0 - 1.2 mg/dL 0.6  0.5     Alkaline Phos 44 - 121 IU/L 124  118     AST 0 - 40 IU/L 53  97     ALT 0 - 44 IU/L 39  73           Past Medical History:  Diagnosis Date   Anxiety     Diabetes mellitus without complication (HCC)     High cholesterol     Hypertension               Past Surgical History:  Procedure Laterality Date   CARDIAC CATHETERIZATION   2016    Most likely procedure, based on his description. Not long after arriving here. They told him nothing was wrong with his heart.        Social History: He is originally for [place].  He is currently incarcerated at [facility].  He quit smoking cigarettes 22 years ago. Past alcohol use.  No drug use.   Family History:  No family history of esophageal,  gastric or colon cancer.  Mother with diabetes.  Father had a stroke.   Allergies       Allergies  Allergen Reactions   Aspirin  Other (See Comments)      Dizzy    Other Other (See Comments)      Neurobion Forte: Skin fell off              Outpatient Encounter Medications as of 12/30/2023  Medication Sig   amLODipine  (NORVASC ) 5 MG tablet Take 1 tablet (5 mg total) by mouth daily.   carvedilol  (COREG ) 25 MG tablet TAKE 1 TABLET BY MOUTH TWICE DAILY WITH A MEAL   clonazePAM  (KLONOPIN ) 1 MG tablet TAKE 1 TABLET BY MOUTH ONCE DAILY AS NEEDED FOR  ANXIETY.  DO  NOT  TAKE  EVERY  DAY   lisinopril  (ZESTRIL ) 5 MG tablet Take 1 tablet (5 mg total) by mouth daily.   metFORMIN  (GLUCOPHAGE ) 500 MG tablet TAKE 1 TABLET BY MOUTH TWICE DAILY WITH A MEAL   PARoxetine  (PAXIL ) 20 MG tablet Take 1 tablet (20 mg total) by mouth daily.   pravastatin  (PRAVACHOL ) 20 MG tablet Take 1 tablet (20 mg total) by mouth daily.      No facility-administered encounter medications on file as of 12/30/2023.        REVIEW OF SYSTEMS:  Gen: Denies fever, sweats or chills. No weight loss.  CV: Denies chest pain, palpitations or edema. Resp: Denies  cough, shortness of breath of hemoptysis.  GI: See HPI. GU: Denies urinary burning, blood in urine, increased urinary frequency or incontinence. MS: Denies joint pain, muscles aches or weakness. Derm: Denies rash, itchiness, skin lesions or unhealing ulcers. Psych: Denies depression, anxiety, memory loss or confusion. Heme: Denies bruising, easy bleeding. Neuro:  Denies headaches, dizziness or paresthesias. Endo:  Denies any problems with DM, thyroid  or adrenal function.   PHYSICAL EXAM: BP (!) 140/86   Pulse 93   Ht 5' 3 (1.6 m)   Wt 209 lb (94.8 kg)   BMI 37.02 kg/m    General: 52 year old male in no acute distress. Head: Normocephalic and atraumatic. Eyes:  Sclerae non-icteric, conjunctive pink. Ears: Normal auditory acuity. Mouth: Dentition intact. No ulcers or lesions.  Neck: Supple, no lymphadenopathy or thyromegaly.  Lungs: Clear bilaterally to auscultation without wheezes, crackles or rhonchi. Heart: Regular rate and rhythm. No murmur, rub or gallop appreciated.  Abdomen: Soft, nontender, nondistended. No masses. No hepatosplenomegaly. Normoactive bowel sounds x 4 quadrants.  Rectal: Deferred, to proceed with colonoscopy for further evaluation. Musculoskeletal: Symmetrical with no gross deformities. Skin: Warm and dry. No rash or lesions on visible extremities. Extremities: No edema. Neurological: Alert oriented x 4, no focal deficits.  Psychological:  Alert and cooperative. Normal mood and affect.   ASSESSMENT AND PLAN:   53 year old male with rectal bleeding and possible hemorrhoids versus rectal prolapse. -Colonoscopy at [facility] per protocol regarding incarcerated patients, benefits and risks discussed including risk with sedation, risk of bleeding, perforation and infection  -Miralax Q HS to avoid straining - CBC, CMP - Further recommendations to be determined after colonoscopy completed   History of elevated LFTs - Labs as ordered above   Diabetes mellitus type  2    Recent H&P as above.  No interval change.  Patient now for colonoscopy.  He is accompanied by Camera operator and a Adult nurse.  He was fully evaluated in the endoscopy admissions area preprocedure.       CC:  No ref. provider  found

## 2024-03-08 ENCOUNTER — Encounter (HOSPITAL_COMMUNITY): Payer: Self-pay | Admitting: Internal Medicine

## 2024-03-09 NOTE — Anesthesia Postprocedure Evaluation (Signed)
 Anesthesia Post Note  Patient: Cameron Haney  Procedure(s) Performed: COLONOSCOPY     Patient location during evaluation: PACU Anesthesia Type: MAC Level of consciousness: awake and alert Pain management: pain level controlled Vital Signs Assessment: post-procedure vital signs reviewed and stable Respiratory status: spontaneous breathing, nonlabored ventilation and respiratory function stable Cardiovascular status: blood pressure returned to baseline and stable Postop Assessment: no apparent nausea or vomiting Anesthetic complications: no   No notable events documented.  Last Vitals:  Vitals:   03/07/24 1225 03/07/24 1230  BP:  (!) 139/96  Pulse: 84 78  Resp: (!) 21 17  Temp:    SpO2: 100% 99%    Last Pain:  Vitals:   03/07/24 1210  TempSrc:   PainSc: 0-No pain   Pain Goal:                   Butler Levander Pinal
